# Patient Record
Sex: Female | Born: 1950 | Race: Black or African American | Hispanic: No | Marital: Single | Smoking: Current some day smoker
Health system: Southern US, Community
[De-identification: ages and names within clinical notes are randomized; demographics above are authoritative.]

## PROBLEM LIST (undated history)

## (undated) DIAGNOSIS — M1990 Unspecified osteoarthritis, unspecified site: Secondary | ICD-10-CM

## (undated) DIAGNOSIS — E785 Hyperlipidemia, unspecified: Secondary | ICD-10-CM

## (undated) DIAGNOSIS — M5430 Sciatica, unspecified side: Secondary | ICD-10-CM

## (undated) DIAGNOSIS — M419 Scoliosis, unspecified: Secondary | ICD-10-CM

## (undated) DIAGNOSIS — I10 Essential (primary) hypertension: Secondary | ICD-10-CM

## (undated) DIAGNOSIS — E079 Disorder of thyroid, unspecified: Secondary | ICD-10-CM

## (undated) HISTORY — PX: TUBAL LIGATION: SHX77

## (undated) HISTORY — PX: GANGLION CYST EXCISION: SHX1691

---

## 2019-10-22 ENCOUNTER — Other Ambulatory Visit: Payer: Self-pay | Admitting: Internal Medicine

## 2019-10-22 DIAGNOSIS — R5381 Other malaise: Secondary | ICD-10-CM

## 2019-10-22 DIAGNOSIS — Z1231 Encounter for screening mammogram for malignant neoplasm of breast: Secondary | ICD-10-CM

## 2019-10-28 ENCOUNTER — Ambulatory Visit (HOSPITAL_COMMUNITY)
Admission: EM | Admit: 2019-10-28 | Discharge: 2019-10-28 | Disposition: A | Discharge status: Home / Self Care | Payer: Medicare Other | Attending: Internal Medicine | Admitting: Internal Medicine

## 2019-10-28 ENCOUNTER — Other Ambulatory Visit: Payer: Self-pay

## 2019-10-28 ENCOUNTER — Encounter (HOSPITAL_COMMUNITY): Payer: Self-pay

## 2019-10-28 ENCOUNTER — Ambulatory Visit (INDEPENDENT_AMBULATORY_CARE_PROVIDER_SITE_OTHER): Payer: Medicare Other

## 2019-10-28 DIAGNOSIS — L03019 Cellulitis of unspecified finger: Secondary | ICD-10-CM

## 2019-10-28 DIAGNOSIS — S6991XA Unspecified injury of right wrist, hand and finger(s), initial encounter: Secondary | ICD-10-CM

## 2019-10-28 DIAGNOSIS — M795 Residual foreign body in soft tissue: Secondary | ICD-10-CM

## 2019-10-28 HISTORY — DX: Scoliosis, unspecified: M41.9

## 2019-10-28 HISTORY — DX: Disorder of thyroid, unspecified: E07.9

## 2019-10-28 HISTORY — DX: Unspecified osteoarthritis, unspecified site: M19.90

## 2019-10-28 HISTORY — DX: Hyperlipidemia, unspecified: E78.5

## 2019-10-28 HISTORY — DX: Essential (primary) hypertension: I10

## 2019-10-28 HISTORY — DX: Sciatica, unspecified side: M54.30

## 2019-10-28 MED ORDER — SULFAMETHOXAZOLE-TRIMETHOPRIM 800-160 MG PO TABS: 1.0000 | ORAL_TABLET | Freq: Two times a day (BID) | ORAL | 0 refills | Status: AC

## 2019-10-28 NOTE — ED Triage Notes (Signed)
Pt states she was washing her dishes with a scrubby and the scrubby went into her right 3rd finger 2 wks ago and 3 days ago the tip of the finger turned black.

## 2019-10-28 NOTE — ED Provider Notes (Addendum)
[facility]    CSN: [id number] Arrival date & time: 10/28/19  1020      History   Chief Complaint Chief Complaint  Patient presents with   Finger Injury    HPI Caroline Mcconnell is a 69 y.o. female.   Patient is a 69 year old female presents today with swelling, tenderness to tip of right middle finger.  Reporting she was washing a pan with a aluminum scrub pad approximate 2 weeks ago and feels like she may have gotten a piece of the scar pad in her finger.  She did not have any bleeding at that time.  3 days ago she started having discoloration to the tip of her finger.  Very tender to touch.  No fevers.     Past Medical History:  Diagnosis Date   Arthritis    Hyperlipidemia    Hypertension    Sciatica    Scoliosis    Thyroid disease     There are no problems to display for this patient.   Past Surgical History:  Procedure Laterality Date   GANGLION CYST EXCISION Right    TUBAL LIGATION      OB History   No obstetric history on file.      Home Medications    Prior to Admission medications   Medication Sig Start Date End Date Taking? Authorizing Provider  amLODipine (NORVASC) 10 MG tablet Take 10 mg by mouth daily. 09/24/19   [provider]  EUTHYROX 25 MCG tablet Take 25 mcg by mouth daily. 09/24/19   [provider]  lisinopril (ZESTRIL) 20 MG tablet Take 20 mg by mouth daily. 09/24/19   [provider]  rosuvastatin (CRESTOR) 20 MG tablet Take 20 mg by mouth at bedtime. 10/21/19   [provider]  sulfamethoxazole-trimethoprim (BACTRIM DS) 800-160 MG tablet Take 1 tablet by mouth 2 (two) times daily for 7 days. 10/28/19 11/04/19  Janace Aris, NP    Family History No family history on file.  Social History Social History   Tobacco Use   Smoking status: Current Every Day Smoker    Packs/day: 0.23    Types: Cigarettes   Smokeless tobacco: Never Used  Substance Use Topics   Alcohol use: Never     Drug use: Never     Allergies   Penicillins   Review of Systems Review of Systems   Physical Exam Triage Vital Signs ED Triage Vitals  Enc Vitals Group     BP 10/28/19 1126 (!) 156/83     Pulse Rate 10/28/19 1126 82     Resp 10/28/19 1126 18     Temp 10/28/19 1126 98.8 F (37.1 C)     Temp Source 10/28/19 1126 Oral     SpO2 10/28/19 1126 96 %     Weight 10/28/19 1127 210 lb (95.3 kg)     Height 10/28/19 1127 5\' 6"  (1.676 m)     Head Circumference --      Peak Flow --      Pain Score 10/28/19 1127 4     Pain Loc --      Pain Edu? --      Excl. in GC? --    No data found.  Updated Vital Signs BP (!) 156/83    Pulse 82    Temp 98.8 F (37.1 C) (Oral)    Resp 18    Ht 5\' 6"  (1.676 m)    Wt 210 lb (95.3 kg)    SpO2 96%  BMI 33.89 kg/m   Visual Acuity Right Eye Distance:   Left Eye Distance:   Bilateral Distance:    Right Eye Near:   Left Eye Near:    Bilateral Near:     Physical Exam Vitals and nursing note reviewed.  Constitutional:      General: She is not in acute distress.    Appearance: Normal appearance. She is not ill-appearing, toxic-appearing or diaphoretic.  HENT:     Head: Normocephalic.     Nose: Nose normal.  Eyes:     Conjunctiva/sclera: Conjunctivae normal.  Pulmonary:     Effort: Pulmonary effort is normal.  Musculoskeletal:        General: Normal range of motion.     Cervical back: Normal range of motion.  Skin:    General: Skin is warm and dry.     Findings: No rash.  Neurological:     Mental Status: She is alert.  Psychiatric:        Mood and Affect: Mood normal.        UC Treatments / Results  Labs (all labs ordered are listed, but only abnormal results are displayed) Labs Reviewed - No data to display  EKG   Radiology DG Finger Middle Right  Result Date: 10/28/2019 CLINICAL DATA:  Injury, foreign body EXAM: RIGHT MIDDLE FINGER 2+V COMPARISON:  None. FINDINGS: There is no evidence of fracture or dislocation.  There is no evidence of arthropathy or other focal bone abnormality. Soft tissue swelling about the distal phalanx. There is a 5 mm radiolucency underlying the dermis. IMPRESSION: Distal middle finger soft tissue swelling. 5 mm lucency underlying the dermis along the anterior aspect may reflect a focus of gas versus foreign body. No acute osseous abnormality or focal erosion. Electronically Signed   By: Stana Bunting M.D.   On: 10/28/2019 12:09    Procedures Incision and Drainage  Date/Time: 10/28/2019 1:14 PM Performed by: Janace Aris, NP Authorized by: Merrilee Jansky, MD   Consent:    Consent obtained:  Verbal   Consent given by:  Patient   Risks discussed:  Bleeding, incomplete drainage and pain   Alternatives discussed:  No treatment, delayed treatment and alternative treatment Location:    Indications for incision and drainage: felon.   Location:  Upper extremity   Upper extremity location:  Finger   Finger location:  R long finger Pre-procedure details:    Skin preparation:  Antiseptic wash Anesthesia (see MAR for exact dosages):    Anesthesia method:  Nerve block   Block needle gauge:  27 G   Block anesthetic:  Lidocaine 2% w/o epi   Block injection procedure:  Anatomic landmarks identified and introduced needle   Block outcome:  Anesthesia achieved Procedure type:    Complexity:  Simple Procedure details:    Incision types:  Stab incision   Incision depth:  Dermal   Scalpel blade:  11   Drainage:  Bloody and purulent   Drainage amount:  Moderate   Wound treatment:  Wound left open   Packing materials:  None Post-procedure details:    Patient tolerance of procedure:  Tolerated well, no immediate complications   (including critical care time)  Medications Ordered in UC Medications - No data to display  Initial Impression / Assessment and Plan / UC Course  I have reviewed the triage vital signs and the nursing notes.  Pertinent labs & imaging results  that were available during my care of the patient were reviewed  by me and considered in my medical decision making (see chart for details).     Felon X-ray with no concern for osteo-.  5 mm radiolucency overlying the dermis may reflect focal gas or foreign body. Patient none with digital block here and incision and drainage done here in clinic today.  Patient tolerated well.  Moderate amount of purulent drainage from site Will place patient on antibiotic coverage with Bactrim due to penicillin allergy Return precautions given. Final Clinical Impressions(s) / UC Diagnoses   Final diagnoses:  Felon of finger     Discharge Instructions     Take the antibiotics as prescribed. Follow up as needed for continued or worsening symptoms     ED Prescriptions    Medication Sig Dispense Auth. Provider   sulfamethoxazole-trimethoprim (BACTRIM DS) 800-160 MG tablet Take 1 tablet by mouth 2 (two) times daily for 7 days. 14 tablet Talana Slatten A, NP     PDMP not reviewed this encounter.   Janace Aris, NP 10/28/19 1313    Dahlia Byes A, NP 10/28/19 1315

## 2019-10-28 NOTE — Discharge Instructions (Addendum)
Take the antibiotics as prescribed Follow up as needed for continued or worsening symptoms  

## 2019-11-03 ENCOUNTER — Other Ambulatory Visit (HOSPITAL_COMMUNITY): Payer: Self-pay | Admitting: Internal Medicine

## 2019-11-03 ENCOUNTER — Inpatient Hospital Stay (HOSPITAL_COMMUNITY): Admission: RE | Admit: 2019-11-03 | Payer: Self-pay | Source: Ambulatory Visit

## 2019-11-03 ENCOUNTER — Other Ambulatory Visit (HOSPITAL_COMMUNITY): Payer: Self-pay

## 2019-11-03 DIAGNOSIS — I700 Atherosclerosis of aorta: Secondary | ICD-10-CM

## 2019-11-05 ENCOUNTER — Other Ambulatory Visit: Payer: Self-pay | Admitting: Internal Medicine

## 2019-11-05 DIAGNOSIS — E038 Other specified hypothyroidism: Secondary | ICD-10-CM

## 2019-11-24 ENCOUNTER — Other Ambulatory Visit (HOSPITAL_COMMUNITY): Payer: Self-pay | Admitting: Internal Medicine

## 2019-11-24 DIAGNOSIS — I739 Peripheral vascular disease, unspecified: Secondary | ICD-10-CM

## 2019-11-24 DIAGNOSIS — Z0000 Encounter for general adult medical examination without abnormal findings: Secondary | ICD-10-CM

## 2019-11-25 ENCOUNTER — Ambulatory Visit (INDEPENDENT_AMBULATORY_CARE_PROVIDER_SITE_OTHER)
Admission: RE | Admit: 2019-11-25 | Discharge: 2019-11-25 | Disposition: A | Discharge status: Home / Self Care | Payer: Medicare Other | Source: Ambulatory Visit | Attending: Internal Medicine | Admitting: Internal Medicine

## 2019-11-25 ENCOUNTER — Ambulatory Visit (HOSPITAL_COMMUNITY)
Admission: RE | Admit: 2019-11-25 | Discharge: 2019-11-25 | Disposition: A | Discharge status: Home / Self Care | Payer: Medicare Other | Source: Ambulatory Visit | Attending: Internal Medicine | Admitting: Internal Medicine

## 2019-11-25 ENCOUNTER — Other Ambulatory Visit: Payer: Self-pay

## 2019-11-25 DIAGNOSIS — I739 Peripheral vascular disease, unspecified: Secondary | ICD-10-CM | POA: Insufficient documentation

## 2019-11-25 DIAGNOSIS — I10 Essential (primary) hypertension: Secondary | ICD-10-CM | POA: Diagnosis not present

## 2019-11-25 DIAGNOSIS — Z0000 Encounter for general adult medical examination without abnormal findings: Secondary | ICD-10-CM | POA: Diagnosis present

## 2019-11-25 DIAGNOSIS — F17200 Nicotine dependence, unspecified, uncomplicated: Secondary | ICD-10-CM | POA: Diagnosis not present

## 2019-11-25 DIAGNOSIS — Z136 Encounter for screening for cardiovascular disorders: Secondary | ICD-10-CM | POA: Diagnosis present

## 2019-11-25 DIAGNOSIS — E785 Hyperlipidemia, unspecified: Secondary | ICD-10-CM | POA: Insufficient documentation

## 2020-09-24 ENCOUNTER — Ambulatory Visit (HOSPITAL_COMMUNITY)
Admission: EM | Admit: 2020-09-24 | Discharge: 2020-09-24 | Disposition: A | Discharge status: Home / Self Care | Payer: Medicare Other | Attending: Medical Oncology | Admitting: Medical Oncology

## 2020-09-24 ENCOUNTER — Encounter (HOSPITAL_COMMUNITY): Payer: Self-pay

## 2020-09-24 ENCOUNTER — Other Ambulatory Visit: Payer: Self-pay

## 2020-09-24 DIAGNOSIS — M65312 Trigger thumb, left thumb: Secondary | ICD-10-CM

## 2020-09-24 MED ORDER — ETODOLAC 400 MG PO TABS: 400.0000 mg | ORAL_TABLET | Freq: Two times a day (BID) | ORAL | 0 refills | Status: DC

## 2020-09-24 NOTE — ED Provider Notes (Signed)
[facility]    CSN: [id number] Arrival date & time: 09/24/20  1005      History   Chief Complaint Chief Complaint  Patient presents with   thumb pain    HPI Caroline Mcconnell is a 70 y.o. female.   HPI  Thumb Pain: Pt reports that she has had a popping sensation of her left thumb for the past 2 months. She reports that it hurts to touch and move. When she moves the joint it pops. She has tried icy hot with tylenol with some relief. No loss of sensation, injury, skin color change.   Past Medical History:  Diagnosis Date   Arthritis    Hyperlipidemia    Hypertension    Sciatica    Scoliosis    Thyroid disease     There are no problems to display for this patient.   Past Surgical History:  Procedure Laterality Date   GANGLION CYST EXCISION Right    TUBAL LIGATION      OB History   No obstetric history on file.      Home Medications    Prior to Admission medications   Medication Sig Start Date End Date Taking? Authorizing Provider  amLODipine (NORVASC) 10 MG tablet Take 10 mg by mouth daily. 09/24/19   [provider]  EUTHYROX 25 MCG tablet Take 25 mcg by mouth daily. 09/24/19   [provider]  lisinopril (ZESTRIL) 20 MG tablet Take 20 mg by mouth daily. 09/24/19   [provider]  rosuvastatin (CRESTOR) 20 MG tablet Take 20 mg by mouth at bedtime. 10/21/19   [provider]    Family History Family History  Family history unknown: Yes    Social History Social History   Tobacco Use   Smoking status: Every Day    Packs/day: 0.23    Types: Cigarettes   Smokeless tobacco: Never  Substance Use Topics   Alcohol use: Never   Drug use: Never     Allergies   Penicillins   Review of Systems Review of Systems  As stated above in HPI Physical Exam Triage Vital Signs ED Triage Vitals  Enc Vitals Group     BP 09/24/20 1114 132/86     Pulse Rate 09/24/20 1111 80     Resp 09/24/20 1111 17     Temp  09/24/20 1111 97.9 F (36.6 C)     Temp Source 09/24/20 1111 Oral     SpO2 09/24/20 1111 97 %     Weight --      Height --      Head Circumference --      Peak Flow --      Pain Score 09/24/20 1110 10     Pain Loc --      Pain Edu? --      Excl. in GC? --    No data found.  Updated Vital Signs BP 132/86   Pulse 80   Temp 97.9 F (36.6 C) (Oral)   Resp 17   LMP  (LMP Unknown)   SpO2 97%   Physical Exam Vitals and nursing note reviewed.  Constitutional:      Appearance: Normal appearance.  Cardiovascular:     Pulses: Normal pulses.  Musculoskeletal:        General: Tenderness present. No swelling.     Comments: Abrupt flexion of the left thumb at DIP joint with palpable and tender nodule of this finger  Skin:    General: Skin is  warm.     Coloration: Skin is not pale.     Findings: No bruising, erythema, lesion or rash.  Neurological:     Mental Status: She is alert.     UC Treatments / Results  Labs (all labs ordered are listed, but only abnormal results are displayed) Labs Reviewed - No data to display  EKG   Radiology No results found.  Procedures Procedures (including critical care time)  Medications Ordered in UC Medications - No data to display  Initial Impression / Assessment and Plan / UC Course  I have reviewed the triage vital signs and the nursing notes.  Pertinent labs & imaging results that were available during my care of the patient were reviewed by me and considered in my medical decision making (see chart for details).     New.  Discussed trigger finger with patient.  I have recommended a thumb spica x2 weeks along with NSAIDs and referral to orthopedics if needed.  Discussed red flag signs and symptoms. Final Clinical Impressions(s) / UC Diagnoses   Final diagnoses:  None   Discharge Instructions   None    ED Prescriptions   None    PDMP not reviewed this encounter.   Rushie Chestnut, [place] 09/24/20 1211

## 2020-09-24 NOTE — ED Triage Notes (Signed)
Pt presents with c/o her left thumb popping when bending it.

## 2021-03-01 ENCOUNTER — Encounter (HOSPITAL_COMMUNITY): Payer: Self-pay | Admitting: *Deleted

## 2021-03-01 ENCOUNTER — Other Ambulatory Visit: Payer: Self-pay

## 2021-03-01 ENCOUNTER — Ambulatory Visit (HOSPITAL_COMMUNITY)
Admission: EM | Admit: 2021-03-01 | Discharge: 2021-03-01 | Disposition: A | Discharge status: Home / Self Care | Payer: Medicare Other | Attending: Physician Assistant | Admitting: Physician Assistant

## 2021-03-01 ENCOUNTER — Ambulatory Visit (INDEPENDENT_AMBULATORY_CARE_PROVIDER_SITE_OTHER): Payer: Medicare Other

## 2021-03-01 DIAGNOSIS — K047 Periapical abscess without sinus: Secondary | ICD-10-CM

## 2021-03-01 DIAGNOSIS — G8929 Other chronic pain: Secondary | ICD-10-CM | POA: Diagnosis not present

## 2021-03-01 DIAGNOSIS — M25522 Pain in left elbow: Secondary | ICD-10-CM | POA: Diagnosis not present

## 2021-03-01 DIAGNOSIS — M7712 Lateral epicondylitis, left elbow: Secondary | ICD-10-CM | POA: Diagnosis not present

## 2021-03-01 DIAGNOSIS — R220 Localized swelling, mass and lump, head: Secondary | ICD-10-CM | POA: Diagnosis not present

## 2021-03-01 MED ORDER — CLINDAMYCIN HCL 300 MG PO CAPS: 300.0000 mg | ORAL_CAPSULE | Freq: Three times a day (TID) | ORAL | 0 refills | Status: DC

## 2021-03-01 MED ORDER — NAPROXEN 375 MG PO TABS: 375.0000 mg | ORAL_TABLET | Freq: Two times a day (BID) | ORAL | 0 refills | Status: DC

## 2021-03-01 NOTE — ED Triage Notes (Signed)
Pt reports Lt sided facial swelling this morning and Pt has swelling to the Lt elbow.

## 2021-03-01 NOTE — ED Provider Notes (Signed)
[facility]    CSN: [id number] Arrival date & time: 03/01/21  1200      History   Chief Complaint Chief Complaint  Patient presents with   Joint Swelling   Facial Swelling    HPI Caroline Mcconnell is a 71 y.o. female.   Patient presents today with several concerns.  She reports this morning waking up and having significant swelling over her left lower jaw.  She has used warm compresses and this is significantly improved but she continues to have some pain prompting evaluation.  She is scheduled to have multiple tooth extractions March 09, 2021 and does have a history of abnormal dentalgia.  She initially had some difficulty swallowing this morning but this has since resolved.  She was able to eat lunch and drink plenty of fluid without any difficulty earlier today.  She denies any recent antibiotics.  Denies any throat swelling, shortness of breath, dysphagia, fever, nausea, vomiting.  In addition, patient reports a several year history of left elbow pain and swelling.  She denies any known injury or increase in activity prior to symptom onset.  She has not had imaging or seen a specialist.  This previously been attributed to arthritis of her PCP.  She has tried NSAIDs without improvement of symptoms.  She is right-handed.  Denies any weakness, numbness, paresthesia.  Denies history of gout or rheumatoid arthritis.  She reports pain is intermittent without identifiable trigger.   Past Medical History:  Diagnosis Date   Arthritis    Hyperlipidemia    Hypertension    Sciatica    Scoliosis    Thyroid disease     There are no problems to display for this patient.   Past Surgical History:  Procedure Laterality Date   GANGLION CYST EXCISION Right    TUBAL LIGATION      OB History   No obstetric history on file.      Home Medications    Prior to Admission medications   Medication Sig Start Date End Date Taking? Authorizing Provider  clindamycin (CLEOCIN) 300  MG capsule Take 1 capsule (300 mg total) by mouth 3 (three) times daily. 03/01/21  Yes Rashad Obeid K, PA-C  naproxen (NAPROSYN) 375 MG tablet Take 1 tablet (375 mg total) by mouth 2 (two) times daily. 03/01/21  Yes Viveca Beckstrom K, PA-C  amLODipine (NORVASC) 10 MG tablet Take 10 mg by mouth daily. 09/24/19   [provider]  EUTHYROX 25 MCG tablet Take 25 mcg by mouth daily. 09/24/19   [provider]  lisinopril (ZESTRIL) 20 MG tablet Take 20 mg by mouth daily. 09/24/19   [provider]  rosuvastatin (CRESTOR) 20 MG tablet Take 20 mg by mouth at bedtime. 10/21/19   [provider]    Family History Family History  Family history unknown: Yes    Social History Social History   Tobacco Use   Smoking status: Every Day    Packs/day: 0.23    Types: Cigarettes   Smokeless tobacco: Never  Substance Use Topics   Alcohol use: Never   Drug use: Never     Allergies   Penicillins   Review of Systems Review of Systems  Constitutional:  Positive for activity change. Negative for appetite change, fatigue and fever.  HENT:  Positive for dental problem and facial swelling. Negative for congestion, drooling, sinus pressure, sneezing, sore throat, trouble swallowing and voice change.   Respiratory:  Negative for cough and shortness of breath.  Cardiovascular:  Negative for chest pain.  Gastrointestinal:  Negative for abdominal pain, diarrhea, nausea and vomiting.  Musculoskeletal:  Positive for arthralgias and joint swelling. Negative for myalgias.  Neurological:  Negative for dizziness, light-headedness and headaches.    Physical Exam Triage Vital Signs ED Triage Vitals  Enc Vitals Group     BP 03/01/21 1226 (!) 143/70     Pulse Rate 03/01/21 1226 86     Resp 03/01/21 1226 18     Temp 03/01/21 1226 98.6 F (37 C)     Temp src --      SpO2 03/01/21 1226 99 %     Weight --      Height --      Head Circumference --      Peak Flow --      Pain Score  03/01/21 1225 0     Pain Loc --      Pain Edu? --      Excl. in GC? --    No data found.  Updated Vital Signs BP (!) 143/70    Pulse 86    Temp 98.6 F (37 C)    Resp 18    LMP  (LMP Unknown)    SpO2 99%   Visual Acuity Right Eye Distance:   Left Eye Distance:   Bilateral Distance:    Right Eye Near:   Left Eye Near:    Bilateral Near:     Physical Exam Vitals reviewed.  Constitutional:      General: She is awake. She is not in acute distress.    Appearance: Normal appearance. She is well-developed. She is not ill-appearing.     Comments: Very pleasant female appears stated age in no acute distress sitting comfortably in exam room on Rollator  HENT:     Head: Normocephalic and atraumatic.     Right Ear: Tympanic membrane, ear canal and external ear normal. Tympanic membrane is not erythematous or bulging.     Left Ear: Tympanic membrane, ear canal and external ear normal. Tympanic membrane is not erythematous or bulging.     Nose:     Right Sinus: No maxillary sinus tenderness or frontal sinus tenderness.     Left Sinus: No maxillary sinus tenderness or frontal sinus tenderness.     Mouth/Throat:     Dentition: Abnormal dentition. Gingival swelling and dental abscesses present.     Pharynx: Uvula midline. No oropharyngeal exudate or posterior oropharyngeal erythema.     Tonsils: No tonsillar exudate or tonsillar abscesses.      Comments: No evidence of Ludwig angina on exam Cardiovascular:     Rate and Rhythm: Normal rate and regular rhythm.     Pulses:          Radial pulses are 2+ on the right side and 2+ on the left side.     Heart sounds: Normal heart sounds, S1 normal and S2 normal. No murmur heard.    Comments: Capillary refill within 2 seconds bilateral fingers Pulmonary:     Effort: Pulmonary effort is normal.     Breath sounds: Normal breath sounds. No wheezing, rhonchi or rales.     Comments: Clear to auscultation bilaterally Musculoskeletal:     Left  elbow: Swelling present. Normal range of motion. Tenderness present in lateral epicondyle.     Comments: Left elbow: Mild swelling and tenderness palpation over lateral epicondyle.  No deformity noted.  Strength 5/5.  Normal active range of motion with flexion and extension.  Lymphadenopathy:     Head:     Right side of head: No submental, submandibular or tonsillar adenopathy.     Left side of head: No submental, submandibular or tonsillar adenopathy.     Cervical: No cervical adenopathy.  Psychiatric:        Behavior: Behavior is cooperative.     UC Treatments / Results  Labs (all labs ordered are listed, but only abnormal results are displayed) Labs Reviewed - No data to display  EKG   Radiology DG Elbow Complete Left  Result Date: 03/01/2021 CLINICAL DATA:  Chronic pain EXAM: LEFT ELBOW - COMPLETE 4 VIEW COMPARISON:  None. FINDINGS: There is no evidence of fracture, dislocation, or joint effusion. There is no evidence of significant arthropathy or other focal bone abnormality. Soft tissues are unremarkable. IMPRESSION: Negative. Electronically Signed   By: Allegra LaiLeah  Strickland M.D.   On: 03/01/2021 13:17    Procedures Procedures (including critical care time)  Medications Ordered in UC Medications - No data to display  Initial Impression / Assessment and Plan / UC Course  I have reviewed the triage vital signs and the nursing notes.  Pertinent labs & imaging results that were available during my care of the patient were reviewed by me and considered in my medical decision making (see chart for details).     Concern for dental infection given clinical presentation with dental pain and associated facial swelling.  Patient has allergy to penicillin so was prescribed clindamycin 300 mg 3 times daily.  Discussed that if she develops any severe abdominal distress including diarrhea she should stop the medication and be seen.  She can use over-the-counter analgesics as needed for  symptom relief.  She is scheduled to see dentist in approximately 1.5 weeks, strongly encouraged to keep this appointment for further evaluation and management.  Discussed that if she has any worsening symptoms including difficulty speaking, difficulty swallowing, swelling of her throat, fever, change in her voice she needs to go to the emergency room.  Strict return precautions given to which she expressed understanding.  X-ray obtained showed no acute abnormality.  Concern for lateral epicondylitis given clinical presentation.  Will treat with NSAIDs and she was instructed not to take additional NSAIDs with this medication (Naprosyn as prescribed) as it will cause stomach bleeding.  Discussed that she should follow-up with sports medicine if symptoms persist and was given contact information for local provider.  Discussed alarm symptoms that would warrant emergent evaluation.  Strict return precautions given to which she expressed understanding.  Final Clinical Impressions(s) / UC Diagnoses   Final diagnoses:  Dental infection  Facial swelling  Left elbow pain  Lateral epicondylitis of left elbow     Discharge Instructions      We are treating you for dental infection.  Please start clindamycin 3 times a day.  This can upset your stomach so take it with food.  If you develop severe abdominal upset including diarrhea please stop the medication and be seen.  Use warm compress on the area of swelling and pain.  It is importantly follow-up with a dentist as soon as possible as we discussed.  If you have any worsening symptoms including difficulty swallowing, swelling of your throat, change in your voice, shortness of breath you need to go to emergency room.  Your x-ray was normal.  Please take Naprosyn to help with potential soft tissue injury.  Do not take additional NSAIDs including aspirin, ibuprofen/Advil, naproxen/Aleve with this medication as it can  cause stomach bleeding.  You can use Tylenol  if needed for additional symptom relief.  If symptoms or not improving please follow-up with sports medicine for further evaluation and management.     ED Prescriptions     Medication Sig Dispense Auth. Provider   clindamycin (CLEOCIN) 300 MG capsule Take 1 capsule (300 mg total) by mouth 3 (three) times daily. 30 capsule Jamonica Schoff K, PA-C   naproxen (NAPROSYN) 375 MG tablet Take 1 tablet (375 mg total) by mouth 2 (two) times daily. 20 tablet Kolsen Choe, Noberto RetortErin K, PA-C      PDMP not reviewed this encounter.   Jeani HawkingRaspet, Chen Saadeh K, PA-C 03/01/21 1332

## 2021-03-01 NOTE — Discharge Instructions (Addendum)
We are treating you for dental infection.  Please start clindamycin 3 times a day.  This can upset your stomach so take it with food.  If you develop severe abdominal upset including diarrhea please stop the medication and be seen.  Use warm compress on the area of swelling and pain.  It is importantly follow-up with a dentist as soon as possible as we discussed.  If you have any worsening symptoms including difficulty swallowing, swelling of your throat, change in your voice, shortness of breath you need to go to emergency room.  Your x-ray was normal.  Please take Naprosyn to help with potential soft tissue injury.  Do not take additional NSAIDs including aspirin, ibuprofen/Advil, naproxen/Aleve with this medication as it can cause stomach bleeding.  You can use Tylenol if needed for additional symptom relief.  If symptoms or not improving please follow-up with sports medicine for further evaluation and management.

## 2021-07-10 ENCOUNTER — Other Ambulatory Visit: Payer: Self-pay

## 2021-07-10 ENCOUNTER — Encounter (HOSPITAL_COMMUNITY): Payer: Self-pay | Admitting: Emergency Medicine

## 2021-07-10 ENCOUNTER — Ambulatory Visit (HOSPITAL_COMMUNITY)
Admission: EM | Admit: 2021-07-10 | Discharge: 2021-07-10 | Disposition: A | Discharge status: Home / Self Care | Payer: Medicare Other

## 2021-07-10 DIAGNOSIS — B349 Viral infection, unspecified: Secondary | ICD-10-CM | POA: Diagnosis not present

## 2021-07-10 MED ORDER — ALBUTEROL SULFATE HFA 108 (90 BASE) MCG/ACT IN AERS: 1.0000 | INHALATION_SPRAY | Freq: Four times a day (QID) | RESPIRATORY_TRACT | 0 refills | Status: DC | PRN

## 2021-07-10 MED ORDER — BENZONATATE 100 MG PO CAPS: 100.0000 mg | ORAL_CAPSULE | Freq: Three times a day (TID) | ORAL | 0 refills | Status: DC

## 2021-07-10 MED ORDER — PREDNISONE 20 MG PO TABS: 40.0000 mg | ORAL_TABLET | Freq: Every day | ORAL | 0 refills | Status: AC

## 2021-07-10 NOTE — ED Triage Notes (Addendum)
Onset 2 days ago of coughing, wheezing, runny nose.  Reports coughing up green phlegm  OTC cough medicine

## 2021-07-10 NOTE — Discharge Instructions (Addendum)
-  Prednisone, 2 pills taken at the same time for 5 days in a row.  Try taking this earlier in the day as it can give you energy. Avoid NSAIDs like ibuprofen and alleve while taking this medication as they can increase your risk of stomach upset and even GI bleeding when in combination with a steroid. You can continue tylenol (acetaminophen) up to 1000mg  3x daily. -Tessalon (Benzonatate) as needed for cough. Take one pill up to 3x daily (every 8 hours) -Albuterol inhaler as needed for cough, wheezing, shortness of breath, 1 to 2 puffs every 6 hours as needed. -Follow-up if symptoms worsen instead of improve - cough, new shortness of breath, coughing up dark or red sputum, new fevers, etc.  -With a virus, you're typically contagious for 5-7 days, or as long as you're having fevers.

## 2021-07-10 NOTE — ED Provider Notes (Signed)
[facility]    CSN: [id number] Arrival date & time: 07/10/21  1059      History   Chief Complaint Chief Complaint  Patient presents with   Cough    HPI Caroline Mcconnell is a 71 y.o. female presenting with cough and congestion for 2 days.  History noncontributory, denies history of pulmonary disease, she is a former smoker.  Describes cough productive of yellow and green sputum, there is no associated dyspnea or shortness of breath.  She states she does have some wheezing at nighttime, but this seems to resolve in the morning.  There is no known fevers, chest pain, dizziness, weakness.  HPI  Past Medical History:  Diagnosis Date   Arthritis    Hyperlipidemia    Hypertension    Sciatica    Scoliosis    Thyroid disease     There are no problems to display for this patient.   Past Surgical History:  Procedure Laterality Date   GANGLION CYST EXCISION Right    TUBAL LIGATION      OB History   No obstetric history on file.      Home Medications    Prior to Admission medications   Medication Sig Start Date End Date Taking? Authorizing Provider  albuterol (VENTOLIN HFA) 108 (90 Base) MCG/ACT inhaler Inhale 1-2 puffs into the lungs every 6 (six) hours as needed for wheezing or shortness of breath. 07/10/21  Yes Rhys MartiniGraham, Crystall Donaldson E, PA-C  benzonatate (TESSALON) 100 MG capsule Take 1 capsule (100 mg total) by mouth every 8 (eight) hours. 07/10/21  Yes Rhys MartiniGraham, Antinio Sanderfer E, PA-C  levothyroxine (SYNTHROID) 25 MCG tablet Take 1 tab by mouth daily for thyroid Oral Once a day for 90 days 07/01/19  Yes [provider]  predniSONE (DELTASONE) 20 MG tablet Take 2 tablets (40 mg total) by mouth daily for 5 days. Take with breakfast or lunch. Avoid NSAIDs (ibuprofen, etc) while taking this medication. 07/10/21 07/15/21 Yes Rhys MartiniGraham, Kristy Schomburg E, PA-C  amLODipine (NORVASC) 10 MG tablet Take 10 mg by mouth daily. 09/24/19   [provider]  clindamycin (CLEOCIN) 300 MG capsule Take 1  capsule (300 mg total) by mouth 3 (three) times daily. Patient not taking: Reported on 07/10/2021 03/01/21   Raspet, Erin K, PA-C  EUTHYROX 25 MCG tablet Take 25 mcg by mouth daily. 09/24/19   [provider]  lisinopril (ZESTRIL) 20 MG tablet Take 20 mg by mouth daily. 09/24/19   [provider]  naproxen (NAPROSYN) 375 MG tablet Take 1 tablet (375 mg total) by mouth 2 (two) times daily. Patient not taking: Reported on 07/10/2021 03/01/21   Raspet, Denny PeonErin K, PA-C  rosuvastatin (CRESTOR) 20 MG tablet Take 20 mg by mouth at bedtime. 10/21/19   [provider]    Family History Family History  Family history unknown: Yes    Social History Social History   Tobacco Use   Smoking status: Every Day    Packs/day: 0.23    Types: Cigarettes   Smokeless tobacco: Never  Vaping Use   Vaping Use: Never used  Substance Use Topics   Alcohol use: Never   Drug use: Never     Allergies   Penicillins   Review of Systems Review of Systems  Constitutional:  Negative for appetite change, chills and fever.  HENT:  Positive for congestion. Negative for ear pain, rhinorrhea, sinus pressure, sinus pain and sore throat.   Eyes:  Negative for redness and visual disturbance.  Respiratory:  Positive for cough. Negative for chest tightness, shortness of breath and wheezing.   Cardiovascular:  Negative for chest pain and palpitations.  Gastrointestinal:  Negative for abdominal pain, constipation, diarrhea, nausea and vomiting.  Genitourinary:  Negative for dysuria, frequency and urgency.  Musculoskeletal:  Negative for myalgias.  Neurological:  Negative for dizziness, weakness and headaches.  Psychiatric/Behavioral:  Negative for confusion.   All other systems reviewed and are negative.   Physical Exam Triage Vital Signs ED Triage Vitals  Enc Vitals Group     BP 07/10/21 1139 135/74     Pulse Rate 07/10/21 1139 73     Resp 07/10/21 1139 20     Temp 07/10/21 1139 98.8 F (37.1  C)     Temp Source 07/10/21 1139 Oral     SpO2 07/10/21 1139 96 %     Weight --      Height --      Head Circumference --      Peak Flow --      Pain Score 07/10/21 1136 4     Pain Loc --      Pain Edu? --      Excl. in GC? --    No data found.  Updated Vital Signs BP 135/74 (BP Location: Right Arm)   Pulse 73   Temp 98.8 F (37.1 C) (Oral)   Resp 20   LMP  (LMP Unknown)   SpO2 96%   Visual Acuity Right Eye Distance:   Left Eye Distance:   Bilateral Distance:    Right Eye Near:   Left Eye Near:    Bilateral Near:     Physical Exam Vitals reviewed.  Constitutional:      General: She is not in acute distress.    Appearance: Normal appearance. She is not ill-appearing.  HENT:     Head: Normocephalic and atraumatic.     Right Ear: Tympanic membrane, ear canal and external ear normal. No tenderness. No middle ear effusion. There is no impacted cerumen. Tympanic membrane is not perforated, erythematous, retracted or bulging.     Left Ear: Tympanic membrane, ear canal and external ear normal. No tenderness.  No middle ear effusion. There is no impacted cerumen. Tympanic membrane is not perforated, erythematous, retracted or bulging.     Nose: Nose normal. No congestion.     Mouth/Throat:     Mouth: Mucous membranes are moist.     Pharynx: Uvula midline. No oropharyngeal exudate or posterior oropharyngeal erythema.  Eyes:     Extraocular Movements: Extraocular movements intact.     Pupils: Pupils are equal, round, and reactive to light.  Cardiovascular:     Rate and Rhythm: Normal rate and regular rhythm.     Heart sounds: Normal heart sounds.  Pulmonary:     Effort: Pulmonary effort is normal.     Breath sounds: Normal breath sounds. No decreased breath sounds, wheezing, rhonchi or rales.     Comments: No adventitious breath sounds. Oxygenating comfortably. Abdominal:     Palpations: Abdomen is soft.     Tenderness: There is no abdominal tenderness. There is no  guarding or rebound.  Lymphadenopathy:     Cervical: No cervical adenopathy.     Right cervical: No superficial cervical adenopathy.    Left cervical: No superficial cervical adenopathy.  Neurological:     General: No focal deficit present.     Mental Status: She is alert and oriented to person, place, and time.  Psychiatric:  Mood and Affect: Mood normal.        Behavior: Behavior normal.        Thought Content: Thought content normal.        Judgment: Judgment normal.     UC Treatments / Results  Labs (all labs ordered are listed, but only abnormal results are displayed) Labs Reviewed - No data to display  EKG   Radiology No results found.  Procedures Procedures (including critical care time)  Medications Ordered in UC Medications - No data to display  Initial Impression / Assessment and Plan / UC Course  I have reviewed the triage vital signs and the nursing notes.  Pertinent labs & imaging results that were available during my care of the patient were reviewed by me and considered in my medical decision making (see chart for details).     This patient is a very pleasant 71 y.o. year old female presenting with viral syndrome x2 days. Today this pt is afebrile nontachycardic nontachypneic, oxygenating well on room air, no wheezes rhonchi or rales. Former smoker, no formal diagnosis of pulm ds. Cough is productive of yellow and green sputum and there is no CP, SOB, or fevers.  Will manage with low-dose prednisone given wheezing at nighttime. She is not a diabetic. Also sent albuterol and tessalon.   ED return precautions discussed. Patient verbalizes understanding and agreement.     Final Clinical Impressions(s) / UC Diagnoses   Final diagnoses:  Viral syndrome     Discharge Instructions      -Prednisone, 2 pills taken at the same time for 5 days in a row.  Try taking this earlier in the day as it can give you energy. Avoid NSAIDs like ibuprofen and  alleve while taking this medication as they can increase your risk of stomach upset and even GI bleeding when in combination with a steroid. You can continue tylenol (acetaminophen) up to 1000mg  3x daily. -Tessalon (Benzonatate) as needed for cough. Take one pill up to 3x daily (every 8 hours) -Albuterol inhaler as needed for cough, wheezing, shortness of breath, 1 to 2 puffs every 6 hours as needed. -Follow-up if symptoms worsen instead of improve - cough, new shortness of breath, coughing up dark or red sputum, new fevers, etc.  -With a virus, you're typically contagious for 5-7 days, or as long as you're having fevers.    ED Prescriptions     Medication Sig Dispense Auth. Provider   predniSONE (DELTASONE) 20 MG tablet Take 2 tablets (40 mg total) by mouth daily for 5 days. Take with breakfast or lunch. Avoid NSAIDs (ibuprofen, etc) while taking this medication. 10 tablet , PA-C   albuterol (VENTOLIN HFA) 108 (90 Base) MCG/ACT inhaler Inhale 1-2 puffs into the lungs every 6 (six) hours as needed for wheezing or shortness of breath. 1 each Rhys Martini, PA-C   benzonatate (TESSALON) 100 MG capsule Take 1 capsule (100 mg total) by mouth every 8 (eight) hours. 21 capsule Rhys Martini, PA-C      PDMP not reviewed this encounter.   Rhys Martini, PA-C 07/10/21 1200

## 2021-09-02 ENCOUNTER — Encounter (HOSPITAL_COMMUNITY): Payer: Self-pay

## 2021-09-02 ENCOUNTER — Ambulatory Visit (HOSPITAL_COMMUNITY)
Admission: EM | Admit: 2021-09-02 | Discharge: 2021-09-02 | Discharge status: Against Medical Advice | Payer: Medicare Other

## 2021-09-02 DIAGNOSIS — Z5321 Procedure and treatment not carried out due to patient leaving prior to being seen by health care provider: Secondary | ICD-10-CM

## 2021-09-02 NOTE — ED Triage Notes (Signed)
Patient having swelling in the tip of the middle finger of the right hand. States last year she got a piece of a metal dish washing scrub pad in the finger. States this was removed a year ago but there is swelling and a hard spot on the tip of the finger now starting 2-3 weeks ago.

## 2021-09-02 NOTE — ED Notes (Signed)
Patient states she has an eye doctor appointment to go to. She states she will come be seen at another time.

## 2021-09-02 NOTE — ED Provider Notes (Signed)
Patient left without being seen by provider   Bing Neighbors, FNP 09/02/21 [phone]

## 2021-12-11 ENCOUNTER — Other Ambulatory Visit: Payer: Self-pay

## 2021-12-11 ENCOUNTER — Emergency Department (HOSPITAL_COMMUNITY): Payer: Medicare Other

## 2021-12-11 ENCOUNTER — Emergency Department (HOSPITAL_COMMUNITY)
Admission: EM | Admit: 2021-12-11 | Discharge: 2021-12-11 | Disposition: A | Discharge status: Home / Self Care | Payer: Medicare Other | Attending: Emergency Medicine | Admitting: Emergency Medicine

## 2021-12-11 ENCOUNTER — Encounter (HOSPITAL_COMMUNITY): Payer: Self-pay

## 2021-12-11 DIAGNOSIS — M546 Pain in thoracic spine: Secondary | ICD-10-CM | POA: Insufficient documentation

## 2021-12-11 DIAGNOSIS — Z1152 Encounter for screening for COVID-19: Secondary | ICD-10-CM | POA: Insufficient documentation

## 2021-12-11 DIAGNOSIS — I10 Essential (primary) hypertension: Secondary | ICD-10-CM | POA: Insufficient documentation

## 2021-12-11 DIAGNOSIS — Z79899 Other long term (current) drug therapy: Secondary | ICD-10-CM | POA: Diagnosis not present

## 2021-12-11 DIAGNOSIS — M549 Dorsalgia, unspecified: Secondary | ICD-10-CM

## 2021-12-11 LAB — TROPONIN I (HIGH SENSITIVITY): Troponin I (High Sensitivity): 14 ng/L (ref <18)

## 2021-12-11 LAB — CBC WITH DIFFERENTIAL/PLATELET
Abs Immature Granulocytes: 0.04 10*3/uL (ref 0.00–0.07)
Basophils Absolute: 0 10*3/uL (ref 0.0–0.1)
Basophils Relative: 0 %
Eosinophils Absolute: 0.2 10*3/uL (ref 0.0–0.5)
Eosinophils Relative: 2 %
HCT: 45.9 % (ref 36.0–46.0)
Hemoglobin: 14.7 g/dL (ref 12.0–15.0)
Immature Granulocytes: 0 %
Lymphocytes Relative: 32 %
Lymphs Abs: 3.3 10*3/uL (ref 0.7–4.0)
MCH: 29.6 pg (ref 26.0–34.0)
MCHC: 32 g/dL (ref 30.0–36.0)
MCV: 92.4 fL (ref 80.0–100.0)
Monocytes Absolute: 0.8 10*3/uL (ref 0.1–1.0)
Monocytes Relative: 7 %
Neutro Abs: 6 10*3/uL (ref 1.7–7.7)
Neutrophils Relative %: 59 %
Platelets: 339 10*3/uL (ref 150–400)
RBC: 4.97 MIL/uL (ref 3.87–5.11)
RDW: 14.6 % (ref 11.5–15.5)
WBC: 10.3 10*3/uL (ref 4.0–10.5)
nRBC: 0 % (ref 0.0–0.2)

## 2021-12-11 LAB — RESP PANEL BY RT-PCR (FLU A&B, COVID) ARPGX2
Influenza A by PCR: NEGATIVE
Influenza B by PCR: NEGATIVE
SARS Coronavirus 2 by RT PCR: NEGATIVE

## 2021-12-11 LAB — BASIC METABOLIC PANEL
Anion gap: 15 (ref 5–15)
BUN: 14 mg/dL (ref 8–23)
CO2: 23 mmol/L (ref 22–32)
Calcium: 9.6 mg/dL (ref 8.9–10.3)
Chloride: 101 mmol/L (ref 98–111)
Creatinine, Ser: 0.79 mg/dL (ref 0.44–1.00)
GFR, Estimated: >60 mL/min (ref >60)
Glucose, Bld: 105 mg/dL — ABNORMAL HIGH (ref 70–99)
Potassium: 3.8 mmol/L (ref 3.5–5.1)
Sodium: 139 mmol/L (ref 135–145)

## 2021-12-11 MED ORDER — IOHEXOL 350 MG/ML SOLN
75.0000 mL | Freq: Once | INTRAVENOUS | Status: AC | PRN
  Administered 2021-12-11: 75 mL via INTRAVENOUS

## 2021-12-11 MED ORDER — NAPROXEN 500 MG PO TABS: 500.0000 mg | ORAL_TABLET | Freq: Two times a day (BID) | ORAL | 0 refills | Status: AC

## 2021-12-11 NOTE — ED Provider Triage Note (Signed)
Emergency Medicine Provider Triage Evaluation Note  Caroline Mcconnell , a 71 y.o. female  was evaluated in triage.  Patient reports a history of hypertension, hyperlipidemia and thyroid disease otherwise healthy.  Patient sent into ER today from outside urgent care for a positive D-dimer.  Patient reports that over the past 1 week she has been experiencing right sided upper back pain which has been constant, no clear aggravating alleviating factors.  She reports that she thought this was due to a cough and cold that she had the week prior.  She was discharged from the urgent care on Friday but her D-dimer was pending at that point she received a call and was told to go to the ER because the test was positive.  Review of Systems  Positive: Back pain Negative: Chest pain/shortness of breath, hemoptysis, extremity swelling/color change, recent surgery/immobilization or any additional concerns.  Physical Exam  BP (!) 145/71 (BP Location: Left Arm)   Pulse 65   Temp 98.3 F (36.8 C) (Oral)   Resp 16   Ht 5\' 6"  (1.676 m)   Wt 81.2 kg   LMP  (LMP Unknown)   SpO2 98%   BMI 28.89 kg/m  Gen:   Awake, no distress   Resp:  Normal effort, lungs clear to auscultation bilaterally MSK:   Moves extremities without difficulty  Other:  Heart regular rate and rhythm  Medical Decision Making  Medically screening exam initiated at 9:10 AM.  Appropriate orders placed.  Seraphim Affinito was informed that the remainder of the evaluation will be completed by another provider, this initial triage assessment does not replace that evaluation, and the importance of remaining in the ED until their evaluation is complete.  Risk versus benefits of CT imaging were discussed, patient wished to proceed.  Note: Portions of this report may have been transcribed using voice recognition software. Every effort was made to ensure accuracy; however, inadvertent computerized transcription errors may still be present.    Deliah Boston, [place] 12/11/21 [phone]

## 2021-12-11 NOTE — ED Triage Notes (Signed)
Pt arrived POV from home stating she went to urgent care a couple days ago c/o upper back pain and pain near her right shoulder blade. Pt states they did an X-ray and called and told her she had a small PE. Pt denies any SHOB.

## 2021-12-11 NOTE — ED Provider Notes (Signed)
[facility] EMERGENCY DEPARTMENT Provider Note   CSN: [id number] Arrival date & time: 12/11/21  [id number]     History PMH: HTN, HLD Chief Complaint  Patient presents with   Back Pain    Caroline Mcconnell is a 71 y.o. female. Patient presenting with a chief complaint of right-sided upper back pain.  She states about 3 weeks ago she had an upper respiratory illness which she improved from.  She states about 2 weeks ago she started noticing the pain in her posterior right shoulder that radiated down her right side.  This pain was severe and constant, nothing made it better or worse, but has alleviated since she has been in the emergency department.  She has only taken Tylenol for her pain.  She says she was seen by urgent care today and had a chest x-ray as well as an elevated D-dimer.  She was referred to the emergency department for evaluation of pulmonary embolism.  She has no history of blood clots.  She has not had any recent travel or surgery.  She denies any shortness of breath, chest pain, abdominal pain, nausea, vomiting, leg swelling or pain, numbness, or weakness.   Back Pain      Home Medications Prior to Admission medications   Medication Sig Start Date End Date Taking? Authorizing Provider  naproxen (NAPROSYN) 500 MG tablet Take 1 tablet (500 mg total) by mouth 2 (two) times daily for 4 days. 12/11/21 12/15/21 Yes Kambre Messner, Adora Fridge, PA-C  albuterol (VENTOLIN HFA) 108 (90 Base) MCG/ACT inhaler Inhale 1-2 puffs into the lungs every 6 (six) hours as needed for wheezing or shortness of breath. 07/10/21   Hazel Sams, PA-C  amLODipine (NORVASC) 10 MG tablet Take 10 mg by mouth daily. 09/24/19   [provider]  benzonatate (TESSALON) 100 MG capsule Take 1 capsule (100 mg total) by mouth every 8 (eight) hours. 07/10/21   Hazel Sams, PA-C  clindamycin (CLEOCIN) 300 MG capsule Take 1 capsule (300 mg total) by mouth 3 (three) times daily. 03/01/21   Raspet, Erin  K, PA-C  EUTHYROX 25 MCG tablet Take 25 mcg by mouth daily. 09/24/19   [provider]  levothyroxine (SYNTHROID) 25 MCG tablet Take 1 tab by mouth daily for thyroid Oral Once a day for 90 days 07/01/19   [provider]  lisinopril (ZESTRIL) 20 MG tablet Take 20 mg by mouth daily. 09/24/19   [provider]  rosuvastatin (CRESTOR) 20 MG tablet Take 20 mg by mouth at bedtime. 10/21/19   [provider]      Allergies    Penicillins and Naproxen    Review of Systems   Review of Systems  Musculoskeletal:  Positive for back pain.  All other systems reviewed and are negative.   Physical Exam Updated Vital Signs BP (!) 165/69 (BP Location: Left Arm)   Pulse (!) 55   Temp 98.1 F (36.7 C)   Resp 17   Ht 5\' 6"  (1.676 m)   Wt 81.2 kg   LMP  (LMP Unknown)   SpO2 99%   BMI 28.89 kg/m  Physical Exam Vitals and nursing note reviewed.  Constitutional:      General: She is not in acute distress.    Appearance: Normal appearance. She is not ill-appearing, toxic-appearing or diaphoretic.  HENT:     Head: Normocephalic and atraumatic.     Nose: No nasal deformity.     Mouth/Throat:     Lips: Pink.  No lesions.     Mouth: Mucous membranes are moist. No injury, lacerations, oral lesions or angioedema.     Pharynx: Oropharynx is clear. Uvula midline. No pharyngeal swelling, oropharyngeal exudate, posterior oropharyngeal erythema or uvula swelling.  Eyes:     General: Gaze aligned appropriately. No scleral icterus.       Right eye: No discharge.        Left eye: No discharge.     Conjunctiva/sclera: Conjunctivae normal.     Right eye: Right conjunctiva is not injected. No exudate or hemorrhage.    Left eye: Left conjunctiva is not injected. No exudate or hemorrhage. Cardiovascular:     Rate and Rhythm: Normal rate and regular rhythm.     Pulses: Normal pulses.          Radial pulses are 2+ on the right side and 2+ on the left side.       Dorsalis pedis  pulses are 2+ on the right side and 2+ on the left side.     Heart sounds: Normal heart sounds, S1 normal and S2 normal. Heart sounds not distant. No murmur heard.    No friction rub. No gallop. No S3 or S4 sounds.  Pulmonary:     Effort: Pulmonary effort is normal. No accessory muscle usage or respiratory distress.     Breath sounds: Normal breath sounds. No stridor. No wheezing, rhonchi or rales.  Chest:     Chest wall: No tenderness.  Abdominal:     General: Abdomen is flat. There is no distension.     Palpations: Abdomen is soft. There is no mass or pulsatile mass.     Tenderness: There is no abdominal tenderness. There is no guarding or rebound.  Musculoskeletal:     Right lower leg: No edema.     Left lower leg: No edema.     Comments: No cervical tenderness. Back pain not reproducible. No signs of trauma  Skin:    General: Skin is warm and dry.     Coloration: Skin is not jaundiced or pale.     Findings: No bruising, erythema, lesion or rash.  Neurological:     General: No focal deficit present.     Mental Status: She is alert and oriented to person, place, and time.     GCS: GCS eye subscore is 4. GCS verbal subscore is 5. GCS motor subscore is 6.  Psychiatric:        Mood and Affect: Mood normal.        Behavior: Behavior normal. Behavior is cooperative.     ED Results / Procedures / Treatments   Labs (all labs ordered are listed, but only abnormal results are displayed) Labs Reviewed  BASIC METABOLIC PANEL - Abnormal; Notable for the following components:      Result Value   Glucose, Bld 105 (*)    All other components within normal limits  RESP PANEL BY RT-PCR (FLU A&B, COVID) ARPGX2  CBC WITH DIFFERENTIAL/PLATELET  TROPONIN I (HIGH SENSITIVITY)    EKG EKG Interpretation  Date/Time:  Sunday December 11 2021 09:13:20 EST Ventricular Rate:  59 PR Interval:  162 QRS Duration: 78 QT Interval:  404 QTC Calculation: 399 R Axis:   20 Text Interpretation: Sinus  bradycardia Otherwise normal ECG No previous ECGs available Confirmed by Lacretia Leigh ([id number]) on 12/11/2021 2:05:29 PM  Radiology CT Angio Chest PE W and/or Wo Contrast  Result Date: 12/11/2021 CLINICAL DATA:  Elevated D-dimer. Clinical concern for pulmonary embolism.  EXAM: CT ANGIOGRAPHY CHEST WITH CONTRAST TECHNIQUE: Multidetector CT imaging of the chest was performed using the standard protocol during bolus administration of intravenous contrast. Multiplanar CT image reconstructions and MIPs were obtained to evaluate the vascular anatomy. RADIATION DOSE REDUCTION: This exam was performed according to the departmental dose-optimization program which includes automated exposure control, adjustment of the mA and/or kV according to patient size and/or use of iterative reconstruction technique. CONTRAST:  71mL OMNIPAQUE IOHEXOL 350 MG/ML SOLN COMPARISON:  None Available. FINDINGS: Cardiovascular: Normally opacified pulmonary arteries with no pulmonary arterial filling defects seen. Enlarged heart. Enlarged central pulmonary arteries with a main pulmonary artery diameter of 3.2 cm. Atheromatous calcifications, including the coronary arteries and aorta. Mediastinum/Nodes: No enlarged mediastinal, hilar, or axillary lymph nodes. Thyroid gland, trachea, and esophagus demonstrate no significant findings. Small hiatal hernia or mildly prominent esophageal ampulla. Lungs/Pleura: Mild linear atelectasis or scarring in both lower lobes and left upper lobe. Pleural fluid or pneumothorax. Upper Abdomen: Unremarkable. Musculoskeletal: Mild thoracic and lower cervical spine degenerative changes. Review of the MIP images confirms the above findings. IMPRESSION: 1. No pulmonary emboli or acute abnormality. 2. Cardiomegaly. 3. Enlarged central pulmonary arteries, compatible with pulmonary arterial hypertension. 4. Calcific coronary artery and aortic atherosclerosis. 5. Small hiatal hernia or mildly prominent esophageal ampulla.  6. Mild linear atelectasis or scarring in both lower lobes and left upper lobe. Aortic Atherosclerosis (ICD10-I70.0). Electronically Signed   By: Claudie Revering M.D.   On: 12/11/2021 12:15    Procedures Procedures  This patient was on telemetry or cardiac monitoring during their time in the ED.    Medications Ordered in ED Medications  iohexol (OMNIPAQUE) 350 MG/ML injection 75 mL (75 mLs Intravenous Contrast Given 12/11/21 1203)    ED Course/ Medical Decision Making/ A&P                           Medical Decision Making Risk Prescription drug management.    MDM  This is a 71 y.o. female who presents to the ED with back pain The differential of this patient includes but is not limited to  PE, Pyelonephritis, MSK, bronchitis, ACS, PTX   Initial Impression  Well appearing and in no acute distress Afebrile and has normal vitals Exam unremarkable  I personally ordered, reviewed, and interpreted all laboratory work and imaging and agree with radiologist interpretation. Results interpreted below:  Labs are without leukocytosis or anemia. Renal function normal. Electrolytes are okay. Resp viral panel negative. Troponin normal. EKG normal and nonischemic.   CTA chest: negative for PE. No sign of PNA. Cardiomegaly and possible pulmonary hypertension noted.   Assessment/Plan:  Workup without PE. Doubt ACS with normal EKG and negative troponin. Possible pulmonary HTN noted on CT, but patient has no shortness of breath or signs of fluid overload. This is likely incidental and can be worked up outpatient. Pain is thought to be musculoskeletal. I have discussed Tylenol use at home as well as limited amt of NSAIDs.  She has no admission needs today. She should f/u with PCP.   Charting Requirements Additional history is obtained from:  Independent historian External Records from outside source obtained and reviewed including: Recent outpatient note Social Determinants of Health:   none Pertinant PMH that complicates patient's illness: HTN, HLD  Patient Care Problems that were addressed during this visit: - Upper back pain on right side: Acute illness This patient was maintained on a cardiac monitor/telemetry. I personally viewed and interpreted  the cardiac monitor which reveals an underlying rhythm of NSR Medications given in ED: n/a Reevaluation of the patient after these medicines showed that the patient improved I have reviewed home medications and made changes accordingly.  Critical Care Interventions: n/a Consultations: n/a Disposition: discharge   Portions of this note were generated with Dragon dictation software. Dictation errors may occur despite best attempts at proofreading.     Final Clinical Impression(s) / ED Diagnoses Final diagnoses:  Upper back pain on right side    Rx / DC Orders ED Discharge Orders          Ordered    naproxen (NAPROSYN) 500 MG tablet  2 times daily        12/11/21 1435              Randolf Sansoucie, Adora Fridge, PA-C 12/11/21 1448    Lacretia Leigh, MD 12/12/21 [phone]

## 2021-12-11 NOTE — Discharge Instructions (Addendum)
You were seen in the ED for right sided upper back pain. We obtained a CT scan to rule out a blood clot and this was normal. We also obtained cardiac markers that were normal as well. This is unlikely to be a heart attack. At this point, we do not find any emergent cause to your symptoms today. I have prescribed you 4 days of Naproxen to take for your pain. You should not take this any longer than 4 days to avoid any stomach issues. Please contact your PCP to schedule a follow up appointment.   Please return if you develop shortness of breath, leg swelling, or other concerning symptoms.

## 2022-03-15 ENCOUNTER — Other Ambulatory Visit: Payer: Self-pay | Admitting: Internal Medicine

## 2022-03-15 DIAGNOSIS — Z0000 Encounter for general adult medical examination without abnormal findings: Secondary | ICD-10-CM

## 2022-03-21 ENCOUNTER — Other Ambulatory Visit: Payer: Self-pay | Admitting: Internal Medicine

## 2022-03-21 DIAGNOSIS — R011 Cardiac murmur, unspecified: Secondary | ICD-10-CM

## 2022-03-28 ENCOUNTER — Ambulatory Visit: Payer: 59

## 2022-03-28 DIAGNOSIS — R011 Cardiac murmur, unspecified: Secondary | ICD-10-CM

## 2022-04-11 ENCOUNTER — Ambulatory Visit: Payer: 59 | Admitting: Cardiology

## 2022-04-11 NOTE — Progress Notes (Unsigned)
Patient referred by Caroline Boston, MD for aortic stenosis  Subjective:   Caroline Mcconnell, female    DOB: 06/21/1950, 72 y.o.   MRN: [id number]   Chief Complaint  Patient presents with   Aortic Stenosis   New Patient (Initial Visit)     HPI  73 y.o. African American female with hypertension, hyperlipidemia, former smoker, arthritis, referred for evaluation of aortic stenosis  Patient lives with granddaughter Caroline Mcconnell, who is on over the phone for today's appointment.  Patient is fairly functional in spite of her being arthritis in the knees, as well as elbows requiring arthrocentesis.  Her activity is most limited by arthritis.  In spite of that, she walks using a walker, and gets around with difficulty.  She goes for 30-minute walk every day, but covers only about half a mile distance at this time.  With this level of activity she denies any complaints of chest pain, shortness of breath.  She also denies any orthopnea, PND, leg edema, presyncope, syncope symptoms   Past Medical History:  Diagnosis Date   Arthritis    Hyperlipidemia    Hypertension    Sciatica    Scoliosis    Thyroid disease      Past Surgical History:  Procedure Laterality Date   GANGLION CYST EXCISION Right    TUBAL LIGATION       Social History   Tobacco Use  Smoking Status Every Day   Packs/day: 0.23   Types: Cigarettes  Smokeless Tobacco Never    Social History   Substance and Sexual Activity  Alcohol Use Never     Family History  Family history unknown: Yes      Current Outpatient Medications:    albuterol (VENTOLIN HFA) 108 (90 Base) MCG/ACT inhaler, Inhale 1-2 puffs into the lungs every 6 (six) hours as needed for wheezing or shortness of breath., Disp: 1 each, Rfl: 0   amLODipine (NORVASC) 10 MG tablet, Take 10 mg by mouth daily., Disp: , Rfl:    benzonatate (TESSALON) 100 MG capsule, Take 1 capsule (100 mg total) by mouth every 8 (eight) hours., Disp: 21 capsule, Rfl: 0    clindamycin (CLEOCIN) 300 MG capsule, Take 1 capsule (300 mg total) by mouth 3 (three) times daily., Disp: 30 capsule, Rfl: 0   EUTHYROX 25 MCG tablet, Take 25 mcg by mouth daily., Disp: , Rfl:    levothyroxine (SYNTHROID) 25 MCG tablet, Take 1 tab by mouth daily for thyroid Oral Once a day for 90 days, Disp: , Rfl:    lisinopril (ZESTRIL) 20 MG tablet, Take 20 mg by mouth daily., Disp: , Rfl:    rosuvastatin (CRESTOR) 20 MG tablet, Take 20 mg by mouth at bedtime., Disp: , Rfl:    Cardiovascular and other pertinent studies:  Reviewed external labs and tests, independently interpreted  EKG 04/12/2022: Sinus rhythm 80 bpm  Old anteroseptal infarct  Echocardiogram 03/28/2022:  Normal LV systolic function with visual EF 55-60%. Left ventricle cavity  is normal in size. Moderate concentric hypertrophy of the left ventricle.  Normal global wall motion. Doppler evidence of grade II (pseudonormal)  diastolic dysfunction, elevated LAP. Calculated EF 57%.  Left atrial cavity is slightly dilated.  Native aortic valve. Moderate (by valve area) to severe (by gradients)  aortic stenosis. Moderate (Grade III) aortic regurgitation. Severe aortic  valve leaflet calcification. AVA (VTI) measures 1.3 cm^2. AV Mean Grad  measures 34.8 mmHg. AV Pk Vel measures 4.41 m/s.  Structurally normal mitral valve.  Mild (Grade I) mitral regurgitation.  Structurally normal tricuspid valve.  Mild tricuspid regurgitation. No  evidence of pulmonary hypertension. RVSP measures 32 mmHg.  No prior available for comparison.   CT chest 12/11/2021: 1. No pulmonary emboli or acute abnormality. 2. Cardiomegaly. 3. Enlarged central pulmonary arteries, compatible with pulmonary arterial hypertension. 4. Calcific coronary artery and aortic atherosclerosis. 5. Small hiatal hernia or mildly prominent esophageal ampulla. 6. Mild linear atelectasis or scarring in both lower lobes and left upper lobe.   Aortic  Atherosclerosis   Recent labs: 12/11/2021: Glucose 105, BUN/Cr 14/0.79. EGFR >60. Na/K 139/3.8.  H/H 14/45. MCV 92. Platelets 339  08/2021: HbA1C 6.0% Chol 232, TG 104, HDL 73, LDL 138 TSH 2.1 normal Trop HS 14    Review of Systems  Cardiovascular:  Negative for chest pain, dyspnea on exertion, leg swelling, orthopnea, palpitations and syncope.  Musculoskeletal:  Positive for joint pain.         Vitals:   04/12/22 0939 04/12/22 0958  BP: (!) 149/77 128/63  Pulse: 83 74     Body mass index is 33.09 kg/m. Filed Weights   04/12/22 0939  Weight: 205 lb (93 kg)     Objective:   Physical Exam Vitals and nursing note reviewed.  Constitutional:      General: She is not in acute distress. Neck:     Vascular: No JVD.  Cardiovascular:     Rate and Rhythm: Normal rate and regular rhythm.     Heart sounds: Murmur heard.     Harsh midsystolic murmur is present with a grade of 3/6 at the upper right sternal border radiating to the neck.     High-pitched blowing decrescendo early diastolic murmur is present with a grade of 2/4 at the upper right sternal border radiating to the apex.  Pulmonary:     Effort: Pulmonary effort is normal.     Breath sounds: Normal breath sounds. No wheezing or rales.  Musculoskeletal:     Right lower leg: No edema.     Left lower leg: No edema.         Visit diagnoses:   ICD-10-CM   1. Nonrheumatic aortic valve stenosis  I35.0 EKG 12-Lead    B Nat Peptide    2. Nonrheumatic aortic valve insufficiency  I35.1        Orders Placed This Encounter  Procedures   B Nat Peptide   EKG 12-Lead      Assessment & Recommendations:   72 y/o [place] American female with hypertension, hyperlipidemia, former smoker, arthritis, referred for evaluation of aortic stenosis  Aortic stenosis/aortic regurgitation: I personally reviewed CT chest 12/2021, and echocardiogram 03/2022. Severely calcified aortic valve, probably trileaflet. I personally  reviewed echocardiogram dated 03/28/2022.  She has increased LVOT gradient, as well as gradients through the aortic valve, giving her dimensionless index of 0.3.  V-max is 4.7 m/sec, mean PG 40 mmHg, AVA 1.1 cm. Increased gradients could partially be due to at least moderate aortic regurgitation. Overall, I believe she has moderate to severe AAS, and moderate AI.  With her level of activity, which is primarily limited to arthritis, she has no symptoms at this time.  I will check BNP to her further risk stratification. I discussed the natural history of aortic stenosis with the patient, and explained to her that she will very likely need aortic valve replacement in her lifetime. I will see her back in 2 months for follow up and repeat an echocardiogram, with strain imaging,  in 09/2022.    Thank you for referring the patient to [place]. Please feel free to contact with any questions.   Nigel Mormon, MD Pager: [phone] Office: [phone]

## 2022-04-12 ENCOUNTER — Encounter: Payer: Self-pay | Admitting: Cardiology

## 2022-04-12 ENCOUNTER — Ambulatory Visit: Payer: 59 | Admitting: Cardiology

## 2022-04-12 VITALS — BP 128/63 | HR 74 | Ht 66.0 in | Wt 205.0 lb

## 2022-04-12 DIAGNOSIS — I350 Nonrheumatic aortic (valve) stenosis: Secondary | ICD-10-CM | POA: Insufficient documentation

## 2022-04-12 DIAGNOSIS — I351 Nonrheumatic aortic (valve) insufficiency: Secondary | ICD-10-CM | POA: Insufficient documentation

## 2022-05-25 LAB — BRAIN NATRIURETIC PEPTIDE: BNP: 14 pg/mL (ref 0.0–100.0)

## 2022-06-15 ENCOUNTER — Encounter: Payer: 59 | Admitting: Cardiology

## 2022-06-15 NOTE — Progress Notes (Deleted)
Patient referred by [name], MD for aortic stenosis  Subjective:   Caroline Mcconnell, female    DOB: Feb 27, 1950, 72 y.o.   MRN: [id number]   No chief complaint on file.    HPI  72 y.o. African American female with hypertension, hyperlipidemia, former smoker, arthritis, referred for evaluation of aortic stenosis  ***  Initial consultation visit 04/2022: Patient lives with granddaughter [name], who is on over the phone for today's appointment.  Patient is fairly functional in spite of her being arthritis in the knees, as well as elbows requiring arthrocentesis.  Her activity is most limited by arthritis.  In spite of that, she walks using a walker, and gets around with difficulty.  She goes for 30-minute walk every day, but covers only about half a mile distance at this time.  With this level of activity she denies any complaints of chest pain, shortness of breath.  She also denies any orthopnea, PND, leg edema, presyncope, syncope symptoms    Current Outpatient Medications:    amLODipine (NORVASC) 10 MG tablet, Take 10 mg by mouth daily., Disp: , Rfl:    atorvastatin (LIPITOR) 40 MG tablet, Take 40 mg by mouth daily., Disp: , Rfl:    ezetimibe (ZETIA) 10 MG tablet, Take 10 mg by mouth daily., Disp: , Rfl:    levothyroxine (SYNTHROID) 25 MCG tablet, Take 1 tab by mouth daily for thyroid Oral Once a day for 90 days, Disp: , Rfl:    lisinopril (ZESTRIL) 20 MG tablet, Take 20 mg by mouth daily., Disp: , Rfl:    naproxen (NAPROSYN) 375 MG tablet, Take 1 tablet by mouth 2 (two) times daily., Disp: , Rfl:    Cardiovascular and other pertinent studies:  Reviewed external labs and tests, independently interpreted  EKG 04/12/2022: Sinus rhythm 80 bpm  Old anteroseptal infarct  Echocardiogram 03/28/2022:  Normal LV systolic function with visual EF 55-60%. Left ventricle cavity  is normal in size. Moderate concentric hypertrophy of the left ventricle.  Normal global wall motion.  Doppler evidence of grade II (pseudonormal)  diastolic dysfunction, elevated LAP. Calculated EF 57%.  Left atrial cavity is slightly dilated.  Native aortic valve. Moderate (by valve area) to severe (by gradients)  aortic stenosis. Moderate (Grade III) aortic regurgitation. Severe aortic  valve leaflet calcification. AVA (VTI) measures 1.3 cm^2. AV Mean Grad  measures 34.8 mmHg. AV Pk Vel measures 4.41 m/s.  Structurally normal mitral valve.  Mild (Grade I) mitral regurgitation.  Structurally normal tricuspid valve.  Mild tricuspid regurgitation. No  evidence of pulmonary hypertension. RVSP measures 32 mmHg.  No prior available for comparison.   CT chest 12/11/2021: 1. No pulmonary emboli or acute abnormality. 2. Cardiomegaly. 3. Enlarged central pulmonary arteries, compatible with pulmonary arterial hypertension. 4. Calcific coronary artery and aortic atherosclerosis. 5. Small hiatal hernia or mildly prominent esophageal ampulla. 6. Mild linear atelectasis or scarring in both lower lobes and left upper lobe.   Aortic Atherosclerosis   Recent labs: 12/11/2021: Glucose 105, BUN/Cr 14/0.79. EGFR >60. Na/K 139/3.8.  H/H 14/45. MCV 92. Platelets 339  08/2021: HbA1C 6.0% Chol 232, TG 104, HDL 73, LDL 138 TSH 2.1 normal Trop HS 14    Review of Systems  Cardiovascular:  Negative for chest pain, dyspnea on exertion, leg swelling, orthopnea, palpitations and syncope.  Musculoskeletal:  Positive for joint pain.         There were no vitals filed for this visit.    There is no height or  weight on file to calculate BMI. There were no vitals filed for this visit.    Objective:   Physical Exam Vitals and nursing note reviewed.  Constitutional:      General: She is not in acute distress. Neck:     Vascular: No JVD.  Cardiovascular:     Rate and Rhythm: Normal rate and regular rhythm.     Heart sounds: Murmur heard.     Harsh midsystolic murmur is present with a grade of  3/6 at the upper right sternal border radiating to the neck.     High-pitched blowing decrescendo early diastolic murmur is present with a grade of 2/4 at the upper right sternal border radiating to the apex.  Pulmonary:     Effort: Pulmonary effort is normal.     Breath sounds: Normal breath sounds. No wheezing or rales.  Musculoskeletal:     Right lower leg: No edema.     Left lower leg: No edema.         Visit diagnoses: No diagnosis found.    No orders of the defined types were placed in this encounter.     Assessment & Recommendations:   72 y/o [place] American female with hypertension, hyperlipidemia, former smoker, arthritis, referred for evaluation of aortic stenosis  *** Aortic stenosis/aortic regurgitation: I personally reviewed CT chest 12/2021, and echocardiogram 03/2022. Severely calcified aortic valve, probably trileaflet. I personally reviewed echocardiogram dated 03/28/2022.  She has increased LVOT gradient, as well as gradients through the aortic valve, giving her dimensionless index of 0.3.  V-max is 4.7 m/sec, mean PG 40 mmHg, AVA 1.1 cm. Increased gradients could partially be due to at least moderate aortic regurgitation. Overall, I believe she has moderate to severe AS, and moderate AI.  With her level of activity, which is primarily limited to arthritis, she has no symptoms at this time.  I will check BNP to her further risk stratification. I discussed the natural history of aortic stenosis with the patient, and explained to her that she will very likely need aortic valve replacement in her lifetime. I will see her back in 2 months for follow up and repeat an echocardiogram, with strain imaging, in 09/2022.    Thank you for referring the patient to [place]. Please feel free to contact with any questions.   Elder Negus, MD Pager: [phone] Office: [phone]

## 2022-06-19 ENCOUNTER — Ambulatory Visit: Payer: 59 | Admitting: Cardiology

## 2022-06-19 ENCOUNTER — Encounter: Payer: Self-pay | Admitting: Cardiology

## 2022-06-19 VITALS — BP 127/68 | HR 76 | Resp 16 | Ht 66.0 in | Wt 207.0 lb

## 2022-06-19 DIAGNOSIS — I350 Nonrheumatic aortic (valve) stenosis: Secondary | ICD-10-CM

## 2022-06-19 DIAGNOSIS — I10 Essential (primary) hypertension: Secondary | ICD-10-CM | POA: Insufficient documentation

## 2022-06-19 DIAGNOSIS — I351 Nonrheumatic aortic (valve) insufficiency: Secondary | ICD-10-CM

## 2022-06-19 MED ORDER — LISINOPRIL 10 MG PO TABS: 10.0000 mg | ORAL_TABLET | Freq: Every day | ORAL | 3 refills | Status: DC

## 2022-06-19 NOTE — Progress Notes (Signed)
Patient referred by Caroline Quitter, MD for aortic stenosis  Subjective:   Caroline Mcconnell, female    DOB: December 30, 1950, 72 y.o.   MRN: [id number]   Chief Complaint  Patient presents with   Nonrheumatic aortic valve stenosis   Follow-up     HPI  72 y.o. African American female with hypertension, hyperlipidemia, former smoker, arthritis, referred for evaluation of aortic stenosis  Patient is doing well.  She tells me that she is feeling "stronger", since addition of ezetimibe.  She is able to walk 4 blocks without any symptoms of chest pain, shortness of breath.  She also denies orthopnea, PND, leg edema, presyncope, syncope symptoms.  Initial consultation visit 04/2022: Patient lives with granddaughter [name], who is on over the phone for today's appointment.  Patient is fairly functional in spite of her being arthritis in the knees, as well as elbows requiring arthrocentesis.  Her activity is most limited by arthritis.  In spite of that, she walks using a walker, and gets around with difficulty.  She goes for 30-minute walk every day, but covers only about half a mile distance at this time.  With this level of activity she denies any complaints of chest pain, shortness of breath.  She also denies any orthopnea, PND, leg edema, presyncope, syncope symptoms    Current Outpatient Medications:    amLODipine (NORVASC) 10 MG tablet, Take 10 mg by mouth daily., Disp: , Rfl:    atorvastatin (LIPITOR) 40 MG tablet, Take 40 mg by mouth daily., Disp: , Rfl:    ezetimibe (ZETIA) 10 MG tablet, Take 10 mg by mouth daily., Disp: , Rfl:    levothyroxine (SYNTHROID) 25 MCG tablet, Take 1 tab by mouth daily for thyroid Oral Once a day for 90 days, Disp: , Rfl:    lisinopril (ZESTRIL) 20 MG tablet, Take 20 mg by mouth daily., Disp: , Rfl:    naproxen (NAPROSYN) 375 MG tablet, Take 1 tablet by mouth 2 (two) times daily., Disp: , Rfl:    Cardiovascular and other pertinent studies:  Reviewed external  labs and tests, independently interpreted  EKG 04/12/2022: Sinus rhythm 80 bpm  Old anteroseptal infarct  Echocardiogram 03/28/2022:  Normal LV systolic function with visual EF 55-60%. Left ventricle cavity  is normal in size. Moderate concentric hypertrophy of the left ventricle.  Normal global wall motion. Doppler evidence of grade II (pseudonormal)  diastolic dysfunction, elevated LAP. Calculated EF 57%.  Left atrial cavity is slightly dilated.  Native aortic valve. Moderate (by valve area) to severe (by gradients)  aortic stenosis. Moderate (Grade III) aortic regurgitation. Severe aortic  valve leaflet calcification. AVA (VTI) measures 1.3 cm^2. AV Mean Grad  measures 34.8 mmHg. AV Pk Vel measures 4.41 m/s.  Structurally normal mitral valve.  Mild (Grade I) mitral regurgitation.  Structurally normal tricuspid valve.  Mild tricuspid regurgitation. No  evidence of pulmonary hypertension. RVSP measures 32 mmHg.  No prior available for comparison.   CT chest 12/11/2021: 1. No pulmonary emboli or acute abnormality. 2. Cardiomegaly. 3. Enlarged central pulmonary arteries, compatible with pulmonary arterial hypertension. 4. Calcific coronary artery and aortic atherosclerosis. 5. Small hiatal hernia or mildly prominent esophageal ampulla. 6. Mild linear atelectasis or scarring in both lower lobes and left upper lobe.   Aortic Atherosclerosis   Recent labs: 12/11/2021: Glucose 105, BUN/Cr 14/0.79. EGFR >60. Na/K 139/3.8.  H/H 14/45. MCV 92. Platelets 339  08/2021: HbA1C 6.0% Chol 232, TG 104, HDL 73, LDL 138 TSH 2.1 normal Trop  HS 14    Review of Systems  Cardiovascular:  Negative for chest pain, dyspnea on exertion, leg swelling, orthopnea, palpitations and syncope.  Musculoskeletal:  Positive for joint pain.         Vitals:   06/19/22 0904  BP: 127/68  Pulse: 76  Resp: 16  SpO2: 96%     Body mass index is 33.41 kg/m. Filed Weights   06/19/22 0904  Weight:  207 lb (93.9 kg)     Objective:   Physical Exam Vitals and nursing note reviewed.  Constitutional:      General: She is not in acute distress. Neck:     Vascular: No JVD.  Cardiovascular:     Rate and Rhythm: Normal rate and regular rhythm.     Heart sounds: Murmur heard.     Harsh midsystolic murmur is present with a grade of 3/6 at the upper right sternal border radiating to the neck.     High-pitched blowing decrescendo early diastolic murmur is present with a grade of 2/4 at the upper right sternal border radiating to the apex.  Pulmonary:     Effort: Pulmonary effort is normal.     Breath sounds: Normal breath sounds. No wheezing or rales.  Musculoskeletal:     Right lower leg: No edema.     Left lower leg: No edema.         Visit diagnoses:   ICD-10-CM   1. Nonrheumatic aortic valve stenosis  I35.0     2. Nonrheumatic aortic valve insufficiency  I35.1        No orders of the defined types were placed in this encounter.     Assessment & Recommendations:   72 y/o [place] American female with hypertension, hyperlipidemia, former smoker, arthritis, referred for evaluation of aortic stenosis   Aortic stenosis/aortic regurgitation: I personally reviewed CT chest 12/2021, and echocardiogram 03/2022. Severely calcified aortic valve, probably trileaflet. I personally reviewed echocardiogram dated 03/28/2022.  She has increased LVOT gradient, as well as gradients through the aortic valve, giving her dimensionless index of 0.3.  V-max is 4.7 m/sec, mean PG 40 mmHg, AVA 1.1 cm. Increased gradients could partially be due to at least moderate aortic regurgitation. Overall, I believe she has moderate to severe AS, and moderate AI.  With her level of activity, which is primarily limited to arthritis, she has no symptoms at this time. BNP was normal in 05/2022. I again discussed the natural history of aortic stenosis with the patient, and explained to her that she will very likely  need aortic valve replacement in her lifetime.  However, there is no immediate need for valve replacement at this time. Will repeat echocardiogram in 09/2022.  Hypertension: With blood pressure normal and possibly likely to improve with increased walking and weight loss, reduce lisinopril to 10 mg daily to avoid any hypotension in the setting of severe aortic stenosis.  Mixed hyperlipidemia: Continue statin and ezetimibe.  Will check lipid panel in 09/2022.  F/u in 3 months Thank you for referring the patient to [place]. Please feel free to contact with any questions.   Elder Negus, MD Pager: [phone] Office: [phone]

## 2022-07-20 ENCOUNTER — Ambulatory Visit: Payer: 59

## 2022-07-20 DIAGNOSIS — I350 Nonrheumatic aortic (valve) stenosis: Secondary | ICD-10-CM

## 2022-07-20 DIAGNOSIS — I351 Nonrheumatic aortic (valve) insufficiency: Secondary | ICD-10-CM

## 2022-07-24 NOTE — Progress Notes (Signed)
Patient aware.

## 2022-08-25 ENCOUNTER — Other Ambulatory Visit (HOSPITAL_COMMUNITY): Payer: Self-pay | Admitting: Cardiology

## 2022-08-26 LAB — BASIC METABOLIC PANEL
BUN/Creatinine Ratio: 13 (ref 12–28)
BUN: 11 mg/dL (ref 8–27)
CO2: 22 mmol/L (ref 20–29)
Calcium: 9.9 mg/dL (ref 8.7–10.3)
Chloride: 101 mmol/L (ref 96–106)
Creatinine, Ser: 0.82 mg/dL (ref 0.57–1.00)
Glucose: 105 mg/dL — ABNORMAL HIGH (ref 70–99)
Potassium: 4 mmol/L (ref 3.5–5.2)
Sodium: 140 mmol/L (ref 134–144)
eGFR: 75 mL/min/{1.73_m2} (ref >59)

## 2022-08-26 LAB — LIPID PANEL W/O CHOL/HDL RATIO
Cholesterol, Total: 192 mg/dL (ref 100–199)
HDL: 65 mg/dL (ref >39)
LDL Chol Calc (NIH): 111 mg/dL — ABNORMAL HIGH (ref 0–99)
Triglycerides: 86 mg/dL (ref 0–149)
VLDL Cholesterol Cal: 16 mg/dL (ref 5–40)

## 2022-08-26 LAB — BRAIN NATRIURETIC PEPTIDE: BNP: 22.7 pg/mL (ref 0.0–100.0)

## 2022-09-13 ENCOUNTER — Ambulatory Visit: Payer: 59 | Admitting: Cardiology

## 2022-09-25 ENCOUNTER — Encounter: Payer: 59 | Admitting: Cardiology

## 2022-09-25 NOTE — Progress Notes (Deleted)
Patient referred by Caroline Quitter, MD for aortic stenosis  Subjective:   Caroline Mcconnell, female    DOB: 12/09/1950, 72 y.o.   MRN: [id number]   No chief complaint on file.    HPI  72 y.o. African American female with hypertension, hyperlipidemia, former smoker, arthritis, referred for evaluation of aortic stenosis  *** Patient is doing well.  She tells me that she is feeling "stronger", since addition of ezetimibe.  She is able to walk 4 blocks without any symptoms of chest pain, shortness of breath.  She also denies orthopnea, PND, leg edema, presyncope, syncope symptoms.  Initial consultation visit 04/2022: Patient lives with granddaughter Caroline Mcconnell, who is on over the phone for today's appointment.  Patient is fairly functional in spite of her being arthritis in the knees, as well as elbows requiring arthrocentesis.  Her activity is most limited by arthritis.  In spite of that, she walks using a walker, and gets around with difficulty.  She goes for 30-minute walk every day, but covers only about half a mile distance at this time.  With this level of activity she denies any complaints of chest pain, shortness of breath.  She also denies any orthopnea, PND, leg edema, presyncope, syncope symptoms    Current Outpatient Medications:    amLODipine (NORVASC) 10 MG tablet, Take 10 mg by mouth daily., Disp: , Rfl:    aspirin EC (BAYER ASPIRIN EC LOW DOSE) 81 MG tablet, Take 81 mg by mouth once., Disp: , Rfl:    atorvastatin (LIPITOR) 40 MG tablet, Take 40 mg by mouth daily., Disp: , Rfl:    ezetimibe (ZETIA) 10 MG tablet, Take 10 mg by mouth daily., Disp: , Rfl:    levothyroxine (SYNTHROID) 25 MCG tablet, Take 1 tab by mouth daily for thyroid Oral Once a day for 90 days, Disp: , Rfl:    lisinopril (ZESTRIL) 10 MG tablet, Take 1 tablet (10 mg total) by mouth daily., Disp: 90 tablet, Rfl: 3   meloxicam (MOBIC) 15 MG tablet, Take 1 tablet by mouth daily., Disp: , Rfl:    methocarbamol  (ROBAXIN) 500 MG tablet, Take 1 tablet by mouth every 8 (eight) hours., Disp: , Rfl:    naproxen (NAPROSYN) 375 MG tablet, Take 1 tablet by mouth 2 (two) times daily., Disp: , Rfl:    Cardiovascular and other pertinent studies:  Reviewed external labs and tests, independently interpreted  EKG 04/12/2022: Sinus rhythm 80 bpm  Old anteroseptal infarct  Echocardiogram 03/28/2022:  Normal LV systolic function with visual EF 55-60%. Left ventricle cavity  is normal in size. Moderate concentric hypertrophy of the left ventricle.  Normal global wall motion. Doppler evidence of grade II (pseudonormal)  diastolic dysfunction, elevated LAP. Calculated EF 57%.  Left atrial cavity is slightly dilated.  Native aortic valve. Moderate (by valve area) to severe (by gradients)  aortic stenosis. Moderate (Grade III) aortic regurgitation. Severe aortic  valve leaflet calcification. AVA (VTI) measures 1.3 cm^2. AV Mean Grad  measures 34.8 mmHg. AV Pk Vel measures 4.41 m/s.  Structurally normal mitral valve.  Mild (Grade I) mitral regurgitation.  Structurally normal tricuspid valve.  Mild tricuspid regurgitation. No  evidence of pulmonary hypertension. RVSP measures 32 mmHg.  No prior available for comparison.   CT chest 12/11/2021: 1. No pulmonary emboli or acute abnormality. 2. Cardiomegaly. 3. Enlarged central pulmonary arteries, compatible with pulmonary arterial hypertension. 4. Calcific coronary artery and aortic atherosclerosis. 5. Small hiatal hernia or mildly prominent esophageal ampulla. 6.  Mild linear atelectasis or scarring in both lower lobes and left upper lobe.   Aortic Atherosclerosis   Recent labs: 08/25/2022: Glucose 105, BUN/Cr 11/0.82. EGFR 75. Na/K 140/4.0.  H/H ***/***. MCV ***. Platelets *** ***HbA1C ***% Chol 192, TG 86, HDL 65, LDL 111 ***TSH ***normal  16/02/958: Glucose 105, BUN/Cr 14/0.79. EGFR >60. Na/K 139/3.8.  H/H 14/45. MCV 92. Platelets 339  08/2021: HbA1C  6.0% Chol 232, TG 104, HDL 73, LDL 138 TSH 2.1 normal Trop HS 14    Review of Systems  Cardiovascular:  Negative for chest pain, dyspnea on exertion, leg swelling, orthopnea, palpitations and syncope.  Musculoskeletal:  Positive for joint pain.         There were no vitals filed for this visit.    There is no height or weight on file to calculate BMI. There were no vitals filed for this visit.    Objective:   Physical Exam Vitals and nursing note reviewed.  Constitutional:      General: She is not in acute distress. Neck:     Vascular: No JVD.  Cardiovascular:     Rate and Rhythm: Normal rate and regular rhythm.     Heart sounds: Murmur heard.     Harsh midsystolic murmur is present with a grade of 3/6 at the upper right sternal border radiating to the neck.     High-pitched blowing decrescendo early diastolic murmur is present with a grade of 2/4 at the upper right sternal border radiating to the apex.  Pulmonary:     Effort: Pulmonary effort is normal.     Breath sounds: Normal breath sounds. No wheezing or rales.  Musculoskeletal:     Right lower leg: No edema.     Left lower leg: No edema.         Visit diagnoses: No diagnosis found.    No orders of the defined types were placed in this encounter.     Assessment & Recommendations:   72 y/o [place] American female with hypertension, hyperlipidemia, former smoker, arthritis, referred for evaluation of aortic stenosis  *** Aortic stenosis/aortic regurgitation: I personally reviewed CT chest 12/2021, and echocardiogram 03/2022. Severely calcified aortic valve, probably trileaflet. I personally reviewed echocardiogram dated 03/28/2022.  She has increased LVOT gradient, as well as gradients through the aortic valve, giving her dimensionless index of 0.3.  V-max is 4.7 m/sec, mean PG 40 mmHg, AVA 1.1 cm. Increased gradients could partially be due to at least moderate aortic regurgitation. Overall, I believe  she has moderate to severe AS, and moderate AI.  With her level of activity, which is primarily limited to arthritis, she has no symptoms at this time. BNP was normal in 05/2022. I again discussed the natural history of aortic stenosis with the patient, and explained to her that she will very likely need aortic valve replacement in her lifetime.  However, there is no immediate need for valve replacement at this time. Will repeat echocardiogram in 01/2023.  Hypertension: With blood pressure normal and possibly likely to improve with increased walking and weight loss, reduce lisinopril to 10 mg daily to avoid any hypotension in the setting of severe aortic stenosis.  Mixed hyperlipidemia: Continue statin and ezetimibe.  Will check lipid panel in 09/2022.  F/u in 3 months Thank you for referring the patient to [place]. Please feel free to contact with any questions.   Elder Negus, MD Pager: [phone] Office: [phone]

## 2022-10-04 ENCOUNTER — Encounter: Payer: Self-pay | Admitting: Cardiology

## 2022-10-04 ENCOUNTER — Ambulatory Visit: Payer: 59 | Admitting: Cardiology

## 2022-10-04 VITALS — BP 125/66 | HR 71 | Resp 16 | Ht 66.0 in | Wt 210.0 lb

## 2022-10-04 DIAGNOSIS — E782 Mixed hyperlipidemia: Secondary | ICD-10-CM

## 2022-10-04 DIAGNOSIS — I351 Nonrheumatic aortic (valve) insufficiency: Secondary | ICD-10-CM

## 2022-10-04 DIAGNOSIS — I10 Essential (primary) hypertension: Secondary | ICD-10-CM

## 2022-10-04 DIAGNOSIS — I350 Nonrheumatic aortic (valve) stenosis: Secondary | ICD-10-CM

## 2022-10-04 MED ORDER — ATORVASTATIN CALCIUM 80 MG PO TABS: 80.0000 mg | ORAL_TABLET | Freq: Every day | ORAL | 3 refills | Status: DC

## 2022-10-04 NOTE — Progress Notes (Signed)
Patient referred by [name], MD for aortic stenosis  Subjective:   Caroline Mcconnell, female    DOB: 1950-05-25, 72 y.o.   MRN: [id number]   Chief Complaint  Patient presents with   Nonrheumatic aortic valve stenosis   Follow-up     HPI  72 y.o. African American female with hypertension, hyperlipidemia, former smoker, arthritis, referred for evaluation of aortic stenosis  Patient is doing well.  She denies any chest pain, shortness of breath, leg edema symptoms.  Also denies any presyncope or syncope symptoms.  Reviewed recent echocardiogram and lipid panel results with the patient, details below.    Initial consultation visit 04/2022: Patient lives with granddaughter Caroline Mcconnell, who is on over the phone for today's appointment.  Patient is fairly functional in spite of her being arthritis in the knees, as well as elbows requiring arthrocentesis.  Her activity is most limited by arthritis.  In spite of that, she walks using a walker, and gets around with difficulty.  She goes for 30-minute walk every day, but covers only about half a mile distance at this time.  With this level of activity she denies any complaints of chest pain, shortness of breath.  She also denies any orthopnea, PND, leg edema, presyncope, syncope symptoms    Current Outpatient Medications:    amLODipine (NORVASC) 10 MG tablet, Take 10 mg by mouth daily., Disp: , Rfl:    aspirin EC (BAYER ASPIRIN EC LOW DOSE) 81 MG tablet, Take 81 mg by mouth once., Disp: , Rfl:    atorvastatin (LIPITOR) 40 MG tablet, Take 40 mg by mouth daily., Disp: , Rfl:    ezetimibe (ZETIA) 10 MG tablet, Take 10 mg by mouth daily., Disp: , Rfl:    levothyroxine (SYNTHROID) 25 MCG tablet, Take 1 tab by mouth daily for thyroid Oral Once a day for 90 days, Disp: , Rfl:    lisinopril (ZESTRIL) 10 MG tablet, Take 1 tablet (10 mg total) by mouth daily., Disp: 90 tablet, Rfl: 3   meloxicam (MOBIC) 15 MG tablet, Take 1 tablet by mouth daily.,  Disp: , Rfl:    methocarbamol (ROBAXIN) 500 MG tablet, Take 1 tablet by mouth every 8 (eight) hours., Disp: , Rfl:    naproxen (NAPROSYN) 375 MG tablet, Take 1 tablet by mouth 2 (two) times daily., Disp: , Rfl:    Cardiovascular and other pertinent studies:  Reviewed external labs and tests, independently interpreted  EKG 04/12/2022: Sinus rhythm 80 bpm  Old anteroseptal infarct  Echocardiogram 07/20/2022:  Left ventricle cavity is normal in size. Mild concentric hypertrophy of  the left ventricle. Normal global wall motion. Normal LV systolic function  with EF 73%. Doppler evidence of grade I (impaired) diastolic dysfunction,  normal LAP.  Left atrial cavity is moderately dilated at 42.9 ml/m^2.  Trileaflet aortic valve with moderate aortic valve leaflet calcification.  Moderate to severe aortic stenosis. Vmax 4.2 m/sec, mean PG 32 mmHg, AVA  1.3 cm by continuity equation, dimensionless index 0.37. Moderate (Grade  II) aortic regurgitation. Increased gradients and velocity could partially  be due to at least moderate aortic regurgitation.  Mild (Grade I) mitral regurgitation.  Mild tricuspid regurgitation.  No evidence of pulmonary hypertension.  Compared to previous study on 03/28/2022, no significant change noted.   CT chest 12/11/2021: 1. No pulmonary emboli or acute abnormality. 2. Cardiomegaly. 3. Enlarged central pulmonary arteries, compatible with pulmonary arterial hypertension. 4. Calcific coronary artery and aortic atherosclerosis. 5. Small hiatal hernia or mildly  prominent esophageal ampulla. 6. Mild linear atelectasis or scarring in both lower lobes and left upper lobe. Aortic Atherosclerosis   Recent labs: 08/25/2022: Glucose 105, BUN/Cr 11/0.82. EGFR 75. Na/K 140/4.0.  Chol 192, TG 86, HDL 65, LDL 111  12/11/2021: Glucose 105, BUN/Cr 14/0.79. EGFR >60. Na/K 139/3.8.  H/H 14/45. MCV 92. Platelets 339  08/2021: HbA1C 6.0% Chol 232, TG 104, HDL 73, LDL  138 TSH 2.1 normal Trop HS 14    Review of Systems  Cardiovascular:  Negative for chest pain, dyspnea on exertion, leg swelling, orthopnea, palpitations and syncope.  Musculoskeletal:  Positive for joint pain.         Vitals:   10/04/22 1128  BP: 125/66  Pulse: 71  Resp: 16  SpO2: 96%      Body mass index is 33.89 kg/m. Filed Weights   10/04/22 1128  Weight: 210 lb (95.3 kg)      Objective:   Physical Exam Vitals and nursing note reviewed.  Constitutional:      General: She is not in acute distress. Neck:     Vascular: No JVD.  Cardiovascular:     Rate and Rhythm: Normal rate and regular rhythm.     Heart sounds: Murmur heard.     Harsh midsystolic murmur is present with a grade of 3/6 at the upper right sternal border radiating to the neck.     High-pitched blowing decrescendo early diastolic murmur is present with a grade of 2/4 at the upper right sternal border radiating to the apex.  Pulmonary:     Effort: Pulmonary effort is normal.     Breath sounds: Normal breath sounds. No wheezing or rales.  Musculoskeletal:     Right lower leg: No edema.     Left lower leg: No edema.         Visit diagnoses:   ICD-10-CM   1. Nonrheumatic aortic valve stenosis  I35.0 Pro b natriuretic peptide (BNP)9[facility])    PCV ECHOCARDIOGRAM COMPLETE    Pro b natriuretic peptide (BNP)9[facility] [facility])    2. Nonrheumatic aortic valve insufficiency  I35.1     3. Essential hypertension  I10     4. Mixed hyperlipidemia  E78.2 Lipid panel    Lipid panel        Orders Placed This Encounter  Procedures   Lipid panel   Pro b natriuretic peptide (BNP)[facility])   PCV ECHOCARDIOGRAM COMPLETE      Assessment & Recommendations:   72 y/o [place] American female with hypertension, hyperlipidemia, former smoker, arthritis, referred for evaluation of aortic stenosis  Aortic stenosis/aortic  regurgitation: I personally reviewed CT chest 12/2021, and echocardiogram 07/2022. Moderate to severe aortic stenosis, moderate (Grade II) aortic regurgitation No immediate indication for aortic valve replacement. Repeat echocardiogram, and check proBNP in 3 months.  Hypertension: Well-controlled.  Mixed hyperlipidemia: Chol 192, TG 86, HDL 65, LDL 111 (08/2022). Increase Lipitor to 80 mg daily. Repeat panel in 3 months.  F/u in 02/2023.    Elder Negus, MD Pager: [phone] Office: [phone]

## 2022-10-13 NOTE — Progress Notes (Signed)
This encounter was created in error - please disregard.

## 2022-10-17 ENCOUNTER — Encounter: Payer: Self-pay | Admitting: Cardiology

## 2022-10-28 IMAGING — DX DG ELBOW COMPLETE 3+V*L*
4 series · 4 of 4 positions shown · non-contrast
Comparison: None.

CLINICAL DATA: Chronic pain

EXAM:
LEFT ELBOW - COMPLETE 4 VIEW

[elbow ap]
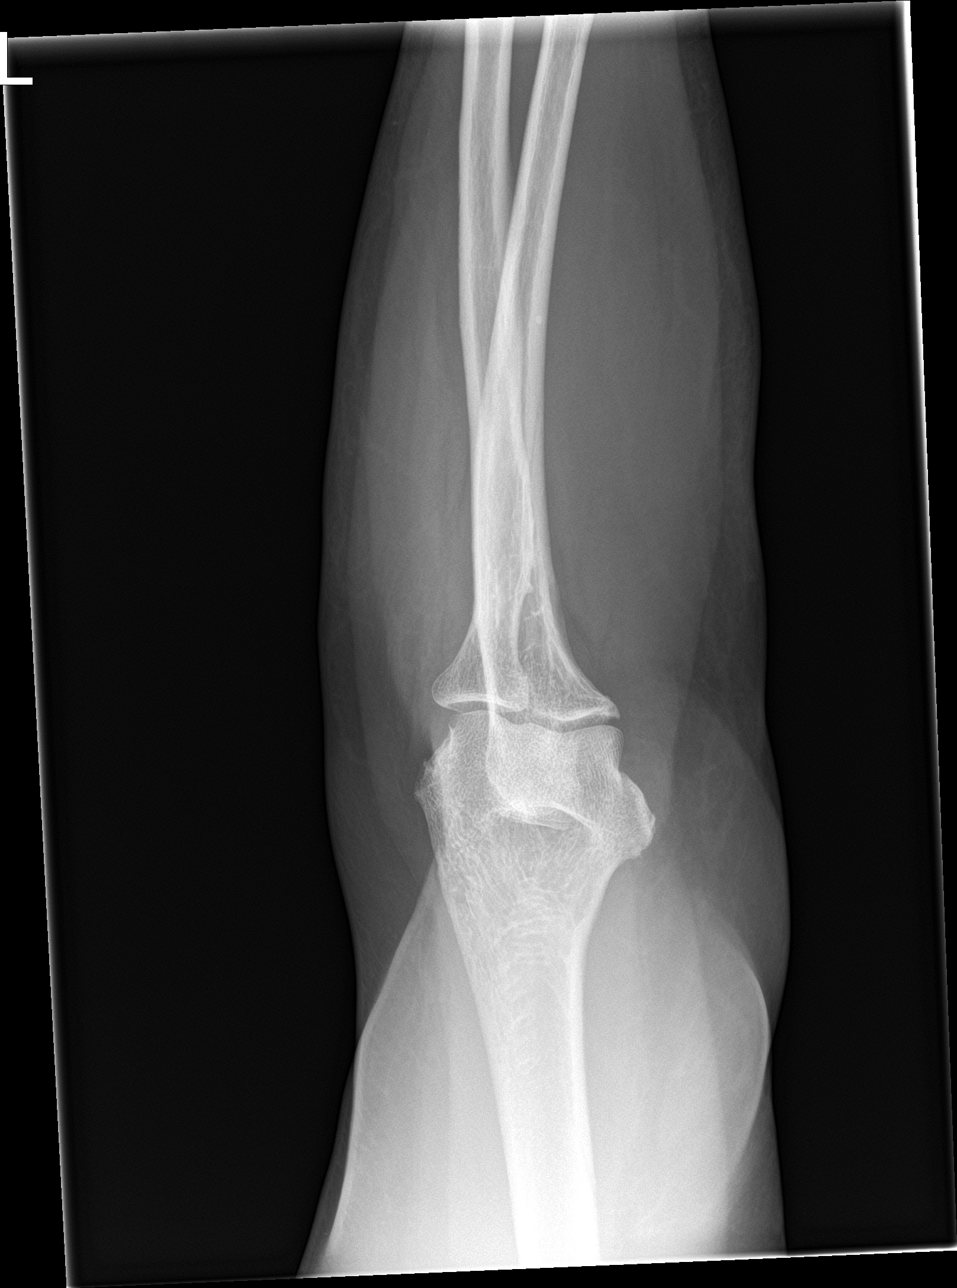

[elbow obl (1 of 2)]
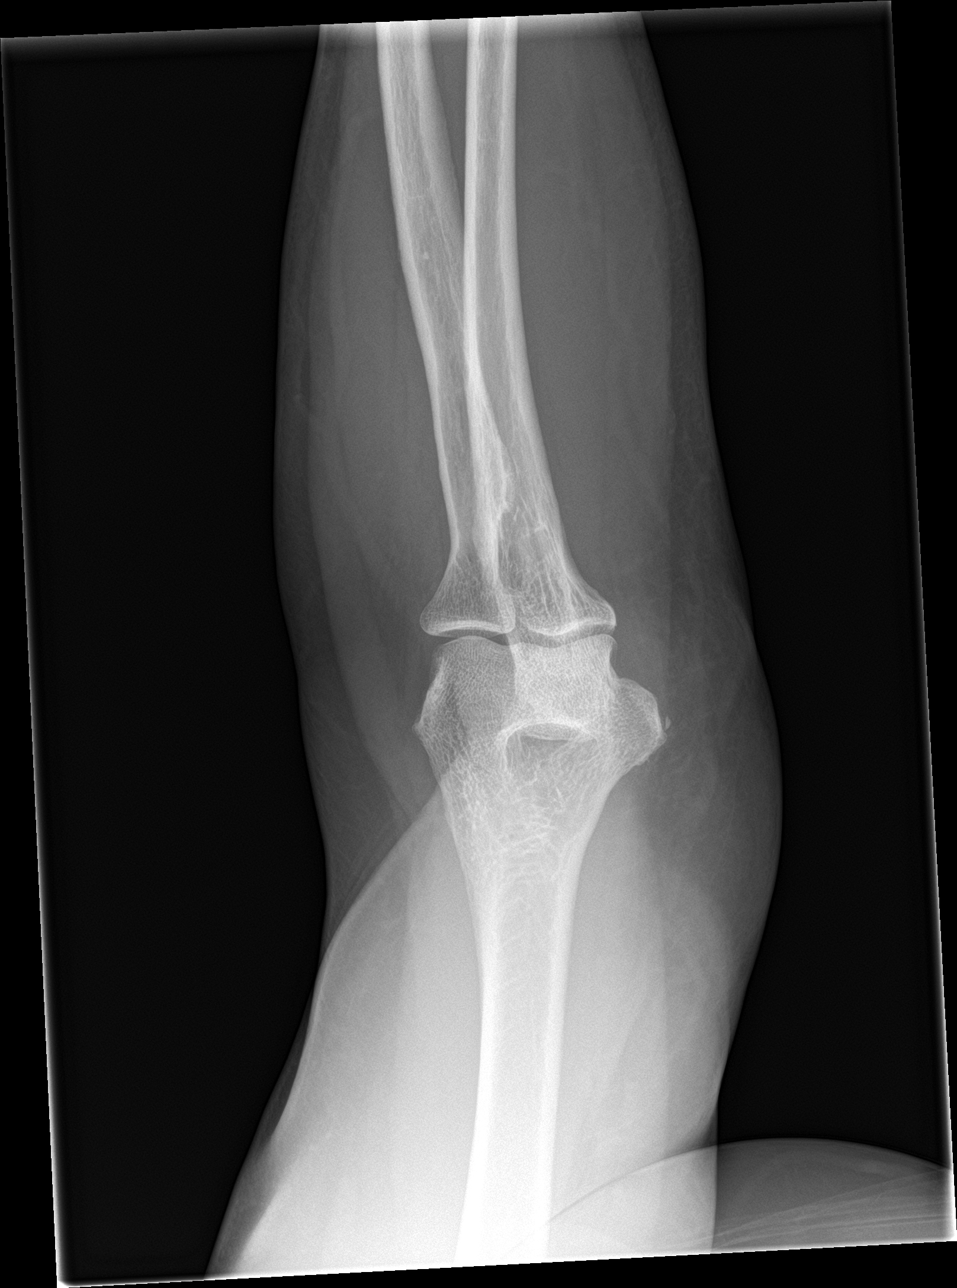

[elbow obl (2 of 2)]
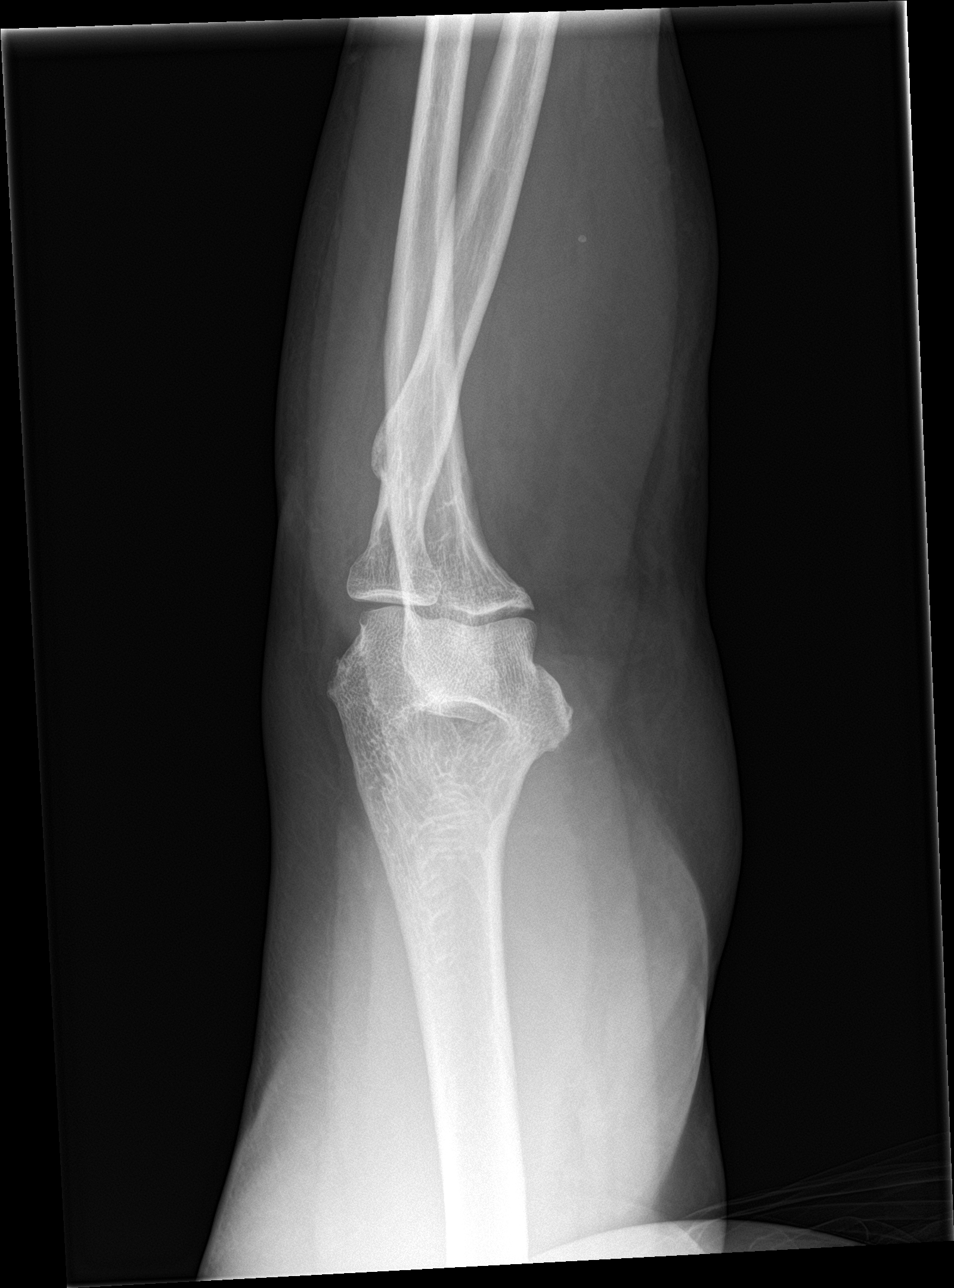

[elbow lat]
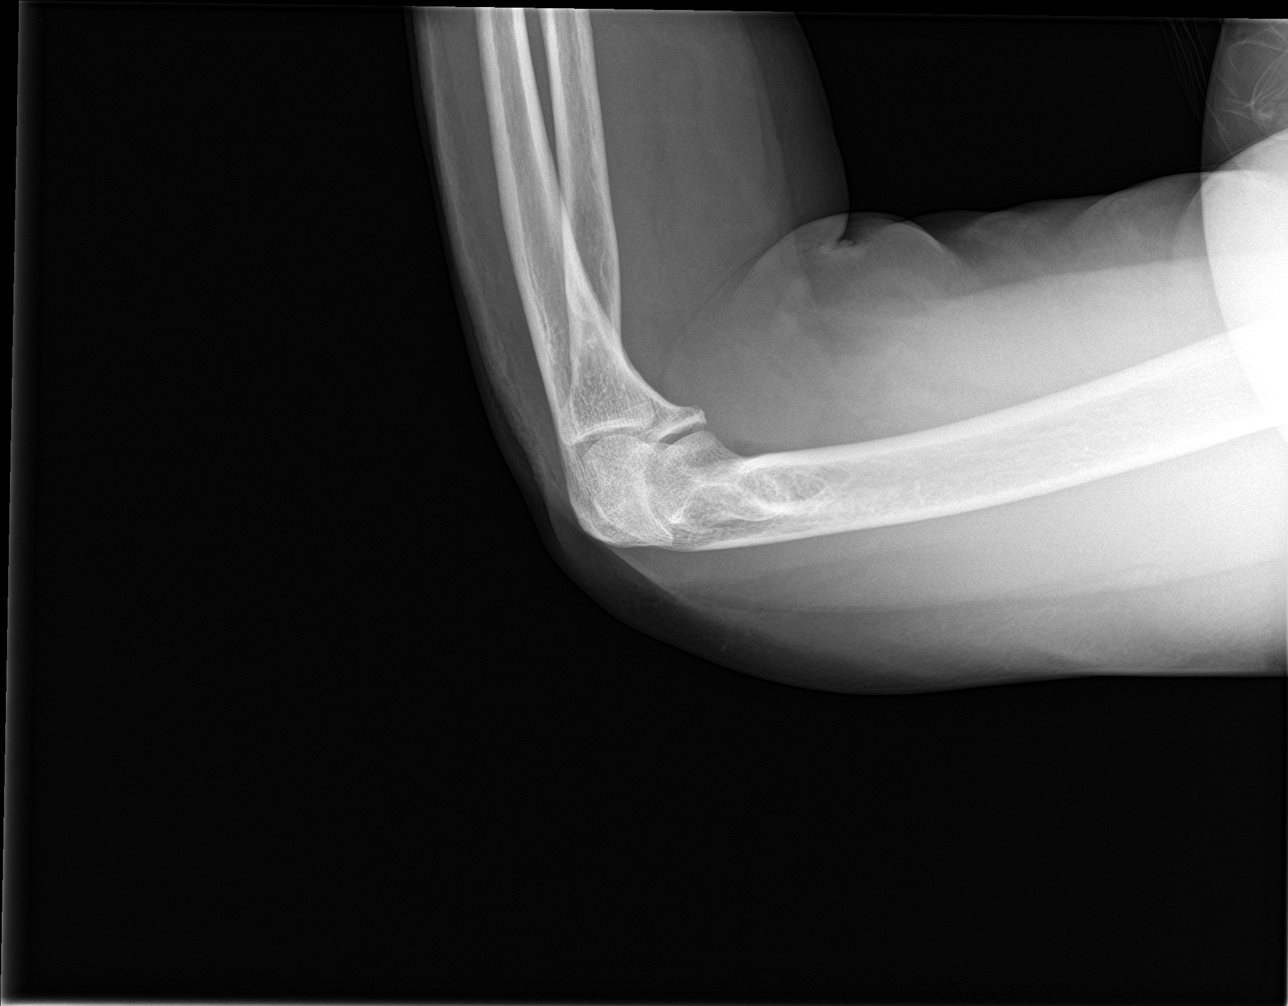

[4 of 4 positions shown; findings below may reference images not displayed]

FINDINGS: There is no evidence of fracture, dislocation, or joint effusion.
There is no evidence of significant arthropathy or other focal bone
abnormality. Soft tissues are unremarkable.
IMPRESSION: Negative.

## 2022-12-18 ENCOUNTER — Other Ambulatory Visit (HOSPITAL_COMMUNITY): Payer: 59

## 2022-12-25 ENCOUNTER — Other Ambulatory Visit: Payer: 59

## 2023-01-18 ENCOUNTER — Ambulatory Visit (HOSPITAL_COMMUNITY): Payer: 59 | Attending: Cardiology

## 2023-01-18 DIAGNOSIS — I350 Nonrheumatic aortic (valve) stenosis: Secondary | ICD-10-CM | POA: Diagnosis present

## 2023-01-18 LAB — ECHOCARDIOGRAM COMPLETE
AR max vel: 1.05 cm2
AV Area VTI: 1.14 cm2
AV Area mean vel: 1.03 cm2
AV Mean grad: 41.6 mm[Hg]
AV Peak grad: 70 mm[Hg]
Ao pk vel: 4.18 m/s
Area-P 1/2: 2.48 cm2
P 1/2 time: 411 ms
S' Lateral: 2.7 cm

## 2023-01-18 NOTE — Progress Notes (Signed)
Noted echocardiogram results and reviewed with previous echocardiogram.  There does appear to be worsening of the aortic regurgitation, along with relatively stable moderate to severe aortic stenosis.  I am away for next 2 weeks, therefore we will be unable to see her until January at the earliest.  Agree with referral to structural valve clinic.

## 2023-01-19 NOTE — Progress Notes (Signed)
Ok. I wish her the same.  Thanks MJP

## 2023-02-09 ENCOUNTER — Ambulatory Visit: Payer: Self-pay | Admitting: Cardiology

## 2023-02-13 ENCOUNTER — Encounter: Payer: Self-pay | Admitting: Cardiology

## 2023-02-13 ENCOUNTER — Ambulatory Visit: Payer: 59 | Attending: Cardiology | Admitting: Cardiology

## 2023-02-13 VITALS — BP 134/62 | HR 77 | Resp 16 | Ht 66.0 in | Wt 203.0 lb

## 2023-02-13 DIAGNOSIS — I350 Nonrheumatic aortic (valve) stenosis: Secondary | ICD-10-CM | POA: Diagnosis not present

## 2023-02-13 DIAGNOSIS — E782 Mixed hyperlipidemia: Secondary | ICD-10-CM | POA: Insufficient documentation

## 2023-02-13 DIAGNOSIS — I351 Nonrheumatic aortic (valve) insufficiency: Secondary | ICD-10-CM | POA: Diagnosis not present

## 2023-02-13 DIAGNOSIS — I10 Essential (primary) hypertension: Secondary | ICD-10-CM | POA: Diagnosis not present

## 2023-02-13 LAB — LIPID PANEL
Chol/HDL Ratio: 3.5 {ratio} (ref 0.0–4.4)
Cholesterol, Total: 258 mg/dL — ABNORMAL HIGH (ref 100–199)
HDL: 74 mg/dL (ref >39)
LDL Chol Calc (NIH): 162 mg/dL — ABNORMAL HIGH (ref 0–99)
Triglycerides: 123 mg/dL (ref 0–149)
VLDL Cholesterol Cal: 22 mg/dL (ref 5–40)

## 2023-02-13 LAB — BASIC METABOLIC PANEL
BUN/Creatinine Ratio: 13 (ref 12–28)
BUN: 10 mg/dL (ref 8–27)
CO2: 24 mmol/L (ref 20–29)
Calcium: 10.1 mg/dL (ref 8.7–10.3)
Chloride: 101 mmol/L (ref 96–106)
Creatinine, Ser: 0.78 mg/dL (ref 0.57–1.00)
Glucose: 104 mg/dL — ABNORMAL HIGH (ref 70–99)
Potassium: 3.8 mmol/L (ref 3.5–5.2)
Sodium: 141 mmol/L (ref 134–144)
eGFR: 80 mL/min/{1.73_m2} (ref >59)

## 2023-02-13 LAB — PRO B NATRIURETIC PEPTIDE: NT-Pro BNP: 276 pg/mL (ref 0–301)

## 2023-02-13 NOTE — Progress Notes (Signed)
 Cardiology Office Note:  .   Date:  02/13/2023  ID:  Caroline Mcconnell, DOB Mar 05, 1950, MRN [id number] PCP: Stephane Leita DEL, MD  [facility] Providers Cardiologist:  Newman Lawrence, MD PCP: Stephane Leita DEL, MD  Chief Complaint  Patient presents with   Nonrheumatic aortic valve stenosis   Follow-up      History of Present Illness: .    Caroline Mcconnell is a 73 y.o. female with hypertension, hyperlipidemia, former smoker, arthritis, aortic stenosis   I personally reviewed and independently completed patient's echocardiogram performed on 01/18/2023.  Patient stays asymptomatic at this time.  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  With this level of activity, she denies any chest pain, shortness of breath, presyncope, syncope, orthopnea, PND, leg edema.  Her lisinopril  was discontinued, by PCP.  Blood pressure is elevated today on first check, subsequently improved.  Also reviewed patient's labs from July and August 2024.  Vitals:   02/13/23 0806 02/13/23 0832  BP: (!) 150/78 134/62  Pulse: 77   Resp: 16   SpO2: 93%      ROS:  Review of Systems  Cardiovascular:  Negative for chest pain, dyspnea on exertion, leg swelling, palpitations and syncope.     Studies Reviewed: SABRA       Independently interpreted 09/2022: Chol 195, TG 90, HDL 61, LDL 116 HbA1C 5.7% Hb 14.7 Cr 0.8 BNP 22  Independently interpreted Echocardiogram 01/18/2023: EF 70 to 75%, grade 1 diastolic function. Normal RV systolic function. Moderate left atrial dilation. Calcified trileaflet aortic valve. Moderate to severe aortic stenosis, severe aortic regurgitation.    Physical Exam:   Physical Exam Vitals and nursing note reviewed.  Constitutional:      General: She is not in acute distress. Neck:     Vascular: No JVD.  Cardiovascular:     Rate and Rhythm: Normal rate  and regular rhythm.     Heart sounds: Murmur heard.     Harsh midsystolic murmur is present with a grade of 3/6 at the upper right sternal border radiating to the neck.     High-pitched blowing decrescendo early diastolic murmur is present with a grade of 2/4 at the upper right sternal border radiating to the apex.  Pulmonary:     Effort: Pulmonary effort is normal.     Breath sounds: Normal breath sounds. No wheezing or rales.  Musculoskeletal:     Right lower leg: No edema.     Left lower leg: No edema.      VISIT DIAGNOSES:   ICD-10-CM   1. Nonrheumatic aortic valve stenosis  I35.0 Pro b natriuretic peptide (BNP)    Basic metabolic panel    ECHOCARDIOGRAM COMPLETE    Lipid Profile    Lipid panel    Lipid Profile    Basic metabolic panel    Pro b natriuretic peptide (BNP)    2. Nonrheumatic aortic valve insufficiency  I35.1 Pro b natriuretic peptide (BNP)    Basic metabolic panel    ECHOCARDIOGRAM COMPLETE    Lipid Profile    Lipid panel    Lipid Profile    Basic metabolic panel    Pro b natriuretic peptide (BNP)    3. Essential hypertension  I10 Pro b natriuretic peptide (BNP)    Basic metabolic panel    Lipid Profile    Lipid  Profile    Basic metabolic panel    Pro b natriuretic peptide (BNP)    4. Mixed hyperlipidemia  E78.2 Pro b natriuretic peptide (BNP)    Basic metabolic panel    Lipid Profile    Lipid Profile    Basic metabolic panel    Pro b natriuretic peptide (BNP)       ASSESSMENT AND PLAN: .    Caroline Mcconnell is a 73 y.o. female with  hypertension, hyperlipidemia, former smoker, arthritis, referred for evaluation of aortic stenosis   Aortic stenosis/aortic regurgitation: Moderate to severe aortic stenosis and aortic regurgitation. However, patient remains completely asymptomatic. BNP in July was normal. I discussed with the patient the following 2 options: Conservative approach with every 72-month clinical follow-up, and every 5-month  echocardiogram, in absence of symptoms versus proceeding with left and right heart catheterization and evaluation by the structural valve team. In absence of symptoms, patient prefers a conservative approach. I will recheck proBNP, and BMP today and check echocardiogram in 6 months.   Hypertension: Fairly well-controlled on amlodipine 10 mg daily.   Mixed hyperlipidemia: Chol 192, TG 86, HDL 65, LDL 111 (08/2022). Continue Lipitor to 80 mg daily. Check lipid panel today.   F/u in 02/2023.  F/u in 3 months  Signed, Newman JINNY Lawrence, MD

## 2023-02-13 NOTE — Patient Instructions (Signed)
 Medication Instructions:   Your physician recommends that you continue on your current medications as directed. Please refer to the Current Medication list given to you today.  *If you need a refill on your cardiac medications before your next appointment, please call your pharmacy*   Lab Work:  TODAY--DOWNSTAIRS AT [facility] 1ST FLOOR--LIPIDS, BMET, AND PRO-BNP  If you have labs (blood work) drawn today and your tests are completely normal, you will receive your results only by: MyChart Message (if you have MyChart) OR A paper copy in the mail If you have any lab test that is abnormal or we need to change your treatment, we will call you to review the results.   Testing/Procedures:  Your physician has requested that you have an echocardiogram. Echocardiography is a painless test that uses sound waves to create images of your heart. It provides your doctor with information about the size and shape of your heart and how well your heart's chambers and valves are working. This procedure takes approximately one hour. There are no restrictions for this procedure. SCHEDULE ECHO TO BE DONE IN 6 MONTHS PER DR. PATWARDHAN   Please do NOT wear cologne, perfume, aftershave, or lotions (deodorant is allowed). Please arrive 15 minutes prior to your appointment time.  Please note: We ask at that you not bring children with you during ultrasound (echo/ vascular) testing. Due to room size and safety concerns, children are not allowed in the ultrasound rooms during exams. Our front office staff cannot provide observation of children in our lobby area while testing is being conducted. An adult accompanying a patient to their appointment will only be allowed in the ultrasound room at the discretion of the ultrasound technician under special circumstances. We apologize for any inconvenience.    Follow-Up:  3 MONTHS WITH DR. PATWARDHAN

## 2023-02-15 ENCOUNTER — Other Ambulatory Visit: Payer: Self-pay

## 2023-02-15 NOTE — Progress Notes (Signed)
 Cholesterol is elevated. Rest of the labs look good. Is she taking Lipitor 80 mg regularly? If not, them please emphasize compliant. If yes, then please add Zetia 10 mg daily.  Thanks MJP

## 2023-02-16 ENCOUNTER — Telehealth: Payer: Self-pay | Admitting: *Deleted

## 2023-02-16 DIAGNOSIS — Z79899 Other long term (current) drug therapy: Secondary | ICD-10-CM

## 2023-02-16 DIAGNOSIS — E782 Mixed hyperlipidemia: Secondary | ICD-10-CM

## 2023-02-16 NOTE — Telephone Encounter (Signed)
 Pt aware that per Dr. Elmira, we will refer her to lipid clinic for consideration of PCSK9-Inhibitors.   Pt aware that I will place the referral in the system and send a message to our scheduler, to call her back to arrange this appt.   Advised her to continue her current regimen for now, and see Pharmacist in lipid clinic for further management.  Pt verbalized understanding and agrees with this plan.

## 2023-02-16 NOTE — Telephone Encounter (Signed)
-----   Message from [facility] sent at 02/16/2023  1:34 PM EST ----- Recommend referral to lipid clinic for consideration for PCSK9i.  Thanks MJP

## 2023-02-16 NOTE — Progress Notes (Signed)
 Recommend referral to lipid clinic for consideration for PCSK9i.  Thanks MJP

## 2023-03-28 ENCOUNTER — Ambulatory Visit: Payer: 59

## 2023-05-11 ENCOUNTER — Telehealth: Payer: Self-pay | Admitting: Pharmacist

## 2023-05-11 ENCOUNTER — Ambulatory Visit: Payer: 59 | Attending: Internal Medicine | Admitting: Pharmacist

## 2023-05-11 ENCOUNTER — Other Ambulatory Visit (HOSPITAL_COMMUNITY): Payer: Self-pay

## 2023-05-11 ENCOUNTER — Encounter: Payer: Self-pay | Admitting: Pharmacist

## 2023-05-11 ENCOUNTER — Telehealth: Payer: Self-pay | Admitting: Pharmacy Technician

## 2023-05-11 VITALS — Ht 66.0 in | Wt 203.0 lb

## 2023-05-11 DIAGNOSIS — E782 Mixed hyperlipidemia: Secondary | ICD-10-CM | POA: Diagnosis not present

## 2023-05-11 MED ORDER — REPATHA SURECLICK 140 MG/ML ~~LOC~~ SOAJ: 140.0000 mg | SUBCUTANEOUS | 3 refills | Status: DC

## 2023-05-11 NOTE — Progress Notes (Signed)
 Patient ID: Caroline Mcconnell                 DOB: 1950-02-23                    MRN: [id number]      HPI: Caroline Mcconnell is a 73 y.o. female patient referred to lipid clinic by Dr.Patwardhan. PMH is significant for hypertension, hyperlipidemia, former smoker, arthritis, aortic stenosis   Patient presented today for lipid clinic. Reports before last lipid lab she was on 40 mg Lipitor. LDL was elevated so they up the Lipitor dose to 80 mg daily and her PCP added Zetia 10 mg daily. She tolerates both well. Follows healthy diet - was 360 lbs when moved from [place] to [place] in 2020, now weigh 203 lbs  Reviewed options for lowering LDL cholesterol, including ezetimibe, PCSK-9 inhibitors, bempedoic acid and inclisiran.  Discussed mechanisms of action, dosing, side effects and potential decreases in LDL cholesterol.  Also reviewed cost information and potential options for patient assistance.  Current Medications: Lipitor 80 mg daily and Zetia 10 mg daily  Intolerances:  Risk Factors: hypertension, hyperlipidemia, former smoker, , aortic stenosis  LDL goal: <70 mg/dl  Last lab 40/9811: LDLc 162, TG 123, TC 258 while on Lipitor 40 mg daily   Diet: stopped drinking soda, fried food and red meat  Eats lots of vegetables, grilled chicken and drinks lot of water   Exercise: walking- 60 min most days of the week   Family History:  Was raised in foster home as kids   Social History:  Alcohol: none  Smoking: occasional- will be talking to PCP for quit option    Labs:  Lipid Panel     Component Value Date/Time   CHOL 258 (H) 02/13/2023 0909   TRIG 123 02/13/2023 0909   HDL 74 02/13/2023 0909   CHOLHDL 3.5 02/13/2023 0909   LDLCALC 162 (H) 02/13/2023 0909   LABVLDL 22 02/13/2023 0909    Past Medical History:  Diagnosis Date   Arthritis    Hyperlipidemia    Hypertension    Sciatica    Scoliosis    Thyroid disease     Current Outpatient Medications on File Prior to Visit  Medication  Sig Dispense Refill   amLODipine (NORVASC) 10 MG tablet Take 10 mg by mouth daily.     aspirin EC (BAYER ASPIRIN EC LOW DOSE) 81 MG tablet Take 81 mg by mouth once.     atorvastatin (LIPITOR) 80 MG tablet Take 1 tablet (80 mg total) by mouth daily. 90 tablet 3   ezetimibe (ZETIA) 10 MG tablet Take 10 mg by mouth daily.     levothyroxine (SYNTHROID) 25 MCG tablet Take 1 tab by mouth daily for thyroid Oral Once a day for 90 days     meloxicam (MOBIC) 15 MG tablet Take 1 tablet by mouth daily.     No current facility-administered medications on file prior to visit.    Allergies  Allergen Reactions   Penicillins Anaphylaxis   Other Other (See Comments)    Assessment/Plan:  1. Hyperlipidemia -  Problem  Mixed Hyperlipidemia   Mixed hyperlipidemia Assessment:  LDL goal: < 70 mg/dl last LDLc 914 mg/dl (78/29) while on Lipitor 40 mg daily  In Jan Lipitor dose was increased to 80 mg daily and Zetia 10 mg was added too  Given last LDLc level and goal LDLc above mention changes are insufficient   Discussed next potential options (PCSK-9 inhibitors, bempedoic  acid and inclisiran); cost, dosing efficacy, side effects  Follows healthy diet - was 360 lbs when moved from [place] to [place] in 2020, now weigh 203 lbs  Walks a lot most days of the week    Plan: Continue taking current medications (Lipitor 80 mg daily and Zetia 10 mg daily) Will apply for PA for PCSK9i; will inform patient upon approval Lipid lab due in 2-3 months after starting PCSK9i    Thank you,  Carmela Hurt, Pharm.D [facility] Division of [facility] [address] [place], [place] [address]  Phone: [phone]; Fax: [phone]

## 2023-05-11 NOTE — Telephone Encounter (Signed)
 Pharmacy Patient Advocate Encounter  Received notification from [facility] that Prior Authorization for Repatha has been APPROVED from 05/11/23 to 11/10/23. Ran test claim, Copay is $0.00- one month. This test claim was processed through [facility]- copay amounts may vary at other pharmacies due to pharmacy/plan contracts, or as the patient moves through the different stages of their insurance plan.   PA #/Case ID/Reference #: [id number]

## 2023-05-11 NOTE — Telephone Encounter (Signed)
 Pharmacy Patient Advocate Encounter   Received notification from Pt Calls Messages that prior authorization for Repatha is required/requested.   Insurance verification completed.   The patient is insured through [place] .   Per test claim: PA required; PA submitted to above mentioned insurance via CoverMyMeds Key/confirmation #/[facility] Status is pending

## 2023-05-11 NOTE — Patient Instructions (Signed)
 Your Results:             Your most recent labs Goal  Total Cholesterol 258 < 200  Triglycerides 123 < 150  HDL (happy/good cholesterol) 74 > 40  LDL (lousy/bad cholesterol 162 < 70   Medication changes: We will start the process to get PCSK9i( Repatha or Praluent)  covered by your insurance.  Once the prior authorization is complete, we will call you to let you know and confirm pharmacy information.    Praluent is a cholesterol medication that improved your body's ability to get rid of "bad cholesterol" known as LDL. It can lower your LDL up to 60%. It is an injection that is given under the skin every 2 weeks. The most common side effects of Praluent include runny nose, symptoms of the common cold, rarely flu or flu-like symptoms, back/muscle pain in about 3-4% of the patients, and redness, pain, or bruising at the injection site.    Repatha is a cholesterol medication that improved your body's ability to get rid of "bad cholesterol" known as LDL. It can lower your LDL up to 60%! It is an injection that is given under the skin every 2 weeks. The medication often requires a prior authorization from your insurance company.  The most common side effects of Repatha include runny nose, symptoms of the common cold, rarely flu or flu-like symptoms, back/muscle pain in about 3-4% of the patients, and redness, pain, or bruising at the injection site.   Lab orders: We want to repeat labs after 2-3 months.  We will send you a lab order to remind you once we get closer to that time.

## 2023-05-11 NOTE — Assessment & Plan Note (Signed)
 Assessment:  LDL goal: < 70 mg/dl last LDLc 161 mg/dl (09/60) while on Lipitor 40 mg daily  In Jan Lipitor dose was increased to 80 mg daily and Zetia 10 mg was added too  Given last LDLc level and goal LDLc above mention changes are insufficient   Discussed next potential options (PCSK-9 inhibitors, bempedoic acid and inclisiran); cost, dosing efficacy, side effects  Follows healthy diet - was 360 lbs when moved from [place] to [place] in 2020, now weigh 203 lbs  Walks a lot most days of the week    Plan: Continue taking current medications (Lipitor 80 mg daily and Zetia 10 mg daily) Will apply for PA for PCSK9i; will inform patient upon approval Lipid lab due in 2-3 months after starting [facility]

## 2023-05-14 ENCOUNTER — Ambulatory Visit: Payer: 59 | Attending: Cardiology | Admitting: Cardiology

## 2023-05-14 ENCOUNTER — Encounter: Payer: Self-pay | Admitting: Cardiology

## 2023-05-14 VITALS — BP 136/60 | HR 78 | Resp 16 | Ht 66.0 in | Wt 205.4 lb

## 2023-05-14 DIAGNOSIS — R600 Localized edema: Secondary | ICD-10-CM

## 2023-05-14 DIAGNOSIS — I350 Nonrheumatic aortic (valve) stenosis: Secondary | ICD-10-CM

## 2023-05-14 DIAGNOSIS — I10 Essential (primary) hypertension: Secondary | ICD-10-CM

## 2023-05-14 DIAGNOSIS — I351 Nonrheumatic aortic (valve) insufficiency: Secondary | ICD-10-CM | POA: Diagnosis not present

## 2023-05-14 DIAGNOSIS — E782 Mixed hyperlipidemia: Secondary | ICD-10-CM

## 2023-05-14 LAB — PRO B NATRIURETIC PEPTIDE: NT-Pro BNP: 288 pg/mL (ref 0–301)

## 2023-05-14 MED ORDER — FUROSEMIDE 20 MG PO TABS: 20.0000 mg | ORAL_TABLET | ORAL | 3 refills | Status: DC | PRN

## 2023-05-14 NOTE — Progress Notes (Signed)
 Cardiology Office Note:  .   Date:  05/14/2023  ID:  Caroline Mcconnell, DOB 1950-11-14, MRN [id number] PCP: [name], MD  [facility] Providers Cardiologist:  Truett Mainland, MD PCP: [name], MD  Chief Complaint  Patient presents with   Aortic Stenosis   Hypertension   Hyperlipidemia   Follow-up      History of Present Illness: .    Caroline Mcconnell is a 73 y.o. female with hypertension, hyperlipidemia, former smoker, arthritis, aortic stenosis   Patient is doing well denies any complaints of chest pain, shortness of breath, presyncope, syncope.  Nonspecific question, she did endorse that she had some leg swelling today.  She is starting Repatha injections today.   Vitals:   05/14/23 0830  BP: 136/60  Pulse: 78  Resp: 16  SpO2: 95%      ROS:  Review of Systems  Cardiovascular:  Negative for chest pain, dyspnea on exertion, leg swelling, palpitations and syncope.     Studies Reviewed: Marland Kitchen        Independently interpreted 02/2023: Chol 258, TG 123, HDL 74, LDL 162 Cr 0.78  Echocardiogram 01/18/2023: EF 70 to 75%, grade 1 diastolic function. Normal RV systolic function. Moderate left atrial dilation. Calcified trileaflet aortic valve. Moderate to severe aortic stenosis, severe aortic regurgitation.    Physical Exam:   Physical Exam Vitals and nursing note reviewed.  Constitutional:      General: She is not in acute distress. Neck:     Vascular: No JVD.  Cardiovascular:     Rate and Rhythm: Normal rate and regular rhythm.     Heart sounds: Murmur heard.     Harsh midsystolic murmur is present with a grade of 3/6 at the upper right sternal border radiating to the neck.     High-pitched blowing decrescendo early diastolic murmur is present with a grade of 2/4 at the upper right sternal border radiating to the apex.  Pulmonary:     Effort: Pulmonary effort is normal.     Breath sounds: Normal breath sounds. No wheezing or rales.   Musculoskeletal:     Right lower leg: No edema.     Left lower leg: No edema.      VISIT DIAGNOSES:   ICD-10-CM   1. Nonrheumatic aortic valve stenosis  I35.0     2. Nonrheumatic aortic valve insufficiency  I35.1     3. Essential hypertension  I10     4. Mixed hyperlipidemia  E78.2         ASSESSMENT AND PLAN: .    Caroline Mcconnell is a 73 y.o. female with  hypertension, hyperlipidemia, former smoker, arthritis, referred for evaluation of aortic stenosis   Aortic stenosis/aortic regurgitation: Moderate to severe aortic stenosis and aortic regurgitation. However, patient remains completely asymptomatic. ProBNP in 02/2023 was normal. Previously, I discussed with the patient the following 2 options: Conservative approach with every 70-month clinical follow-up, and every 66-month echocardiogram, in absence of symptoms versus proceeding with left and right heart catheterization and evaluation by the structural valve team. In absence of symptoms, patient prefers a conservative approach.  Leg edema noted on exam today.  This could be due to amlodipine 10 mg daily, varicose veins, or fluid retention related to aortic stenosis/regurgitation. I do not think we will be able to reduce amlodipine dose without losing blood pressure control. Prescribed Lasix per milligram daily for as needed use. Will check proBNP today. Recommend low-salt diet, leg elevation at night. Check echocardiogram  in 08/2023, unless proBNP is elevated.  In that case, we will check in sooner.  Hypertension: Fairly well-controlled on amlodipine 10 mg daily.   Mixed hyperlipidemia: Chol 192, TG 86, HDL 65, LDL 111 (08/2022). Continue Lipitor to 80 mg daily, Zetia 10 mg daily. Starting Repatha injections today.  F/u in 3 months  Signed, Elder Negus, MD

## 2023-05-14 NOTE — Patient Instructions (Signed)
 Medication Instructions:  START Lasix 20 mg take one tablet by mouth as needed for swelling.   *If you need a refill on your cardiac medications before your next appointment, please call your pharmacy*  Lab Work: ProBNP  If you have labs (blood work) drawn today and your tests are completely normal, you will receive your results only by: MyChart Message (if you have MyChart) OR A paper copy in the mail If you have any lab test that is abnormal or we need to change your treatment, we will call you to review the results.  Testing/Procedures: ECHO IN JULY 2025  Follow-Up: At [facility], you and your health needs are our priority.  As part of our continuing mission to provide you with exceptional heart care, our providers are all part of one team.  This team includes your primary Cardiologist (physician) and Advanced Practice Providers or APPs (Physician Assistants and Nurse Practitioners) who all work together to provide you with the care you need, when you need it.  Your next appointment:   08/2023   Provider:   Elder Negus, MD     We recommend signing up for the patient portal called "MyChart".  Sign up information is provided on this After Visit Summary.  MyChart is used to connect with patients for Virtual Visits (Telemedicine).  Patients are able to view lab/test results, encounter notes, upcoming appointments, etc.  Non-urgent messages can be sent to your provider as well.   To learn more about what you can do with MyChart, go to [email or web address].   Other Instructions      1st Floor: - Lobby - Registration  - Pharmacy  - Lab - Cafe  2nd Floor: - PV Lab - Diagnostic Testing (echo, CT, nuclear med)  3rd Floor: - Vacant  4th Floor: - TCTS (cardiothoracic surgery) - AFib Clinic - Structural Heart Clinic - Vascular Surgery  - Vascular Ultrasound  5th Floor: - HeartCare Cardiology (general and EP) - Clinical Pharmacy for coumadin,  hypertension, lipid, weight-loss medications, and med management appointments    Valet parking services will be available as well.

## 2023-05-15 NOTE — Progress Notes (Signed)
 Marker of congestion is normal. I suspect leg swelling could be due to venous insufficiency. Recommend leg elevation and compression stockings.  Thanks MJP

## 2023-05-17 ENCOUNTER — Telehealth: Payer: Self-pay | Admitting: Cardiology

## 2023-05-17 NOTE — Telephone Encounter (Signed)
 Pt returning call for lab results

## 2023-05-18 NOTE — Telephone Encounter (Signed)
Review result note

## 2023-06-01 NOTE — Telephone Encounter (Signed)
 PA for Repatha  approved see office visit encounter for more info.

## 2023-07-16 ENCOUNTER — Telehealth: Payer: Self-pay | Admitting: Cardiology

## 2023-07-16 NOTE — Telephone Encounter (Signed)
 Pt c/o medication issue:  1. Name of Medication: Evolocumab  (REPATHA  SURECLICK) 140 MG/ML SOAJ   2. How are you currently taking this medication (dosage and times per day)? Inject 140 mg into the skin every 14 (fourteen) days.   3. Are you having a reaction (difficulty breathing--STAT)? No  4. What is your medication issue? Patient is calling because the patient's granddaughter picked up the medication and moved to [place] . Patient's granddaughter accidentally took the medication with her to [place] . Patient has been unable to start the medication and would like a refill sent to the [facility] at [facility]. Patient was supposed to have labs drawn this week, but will now wait until she starts the medication. Please advise.

## 2023-07-17 NOTE — Telephone Encounter (Signed)
 Refill is most likely going to be too soon. Called pt to see if she took the entire 90 days.

## 2023-07-25 MED ORDER — REPATHA SURECLICK 140 MG/ML ~~LOC~~ SOAJ: 140.0000 mg | SUBCUTANEOUS | 3 refills | Status: AC

## 2023-07-25 NOTE — Telephone Encounter (Signed)
 It looks like [facility] dispensed a 90 day supply on 4/4 so it will most likely be too soon to fill. Tired to call pt but no answer x 2

## 2023-07-26 NOTE — Telephone Encounter (Signed)
 I talked to patient, I gave her # for product replacement to see if they will replace a few of them.

## 2023-07-27 NOTE — Telephone Encounter (Signed)
 Called and gave verbal to [facility] for product replacement for Repatha  4 pens

## 2023-08-13 ENCOUNTER — Ambulatory Visit (HOSPITAL_COMMUNITY)
Admission: RE | Admit: 2023-08-13 | Discharge: 2023-08-13 | Disposition: A | Discharge status: Home / Self Care | Payer: 59 | Source: Ambulatory Visit | Attending: Psychiatry | Admitting: Psychiatry

## 2023-08-13 DIAGNOSIS — I350 Nonrheumatic aortic (valve) stenosis: Secondary | ICD-10-CM | POA: Insufficient documentation

## 2023-08-13 DIAGNOSIS — I351 Nonrheumatic aortic (valve) insufficiency: Secondary | ICD-10-CM | POA: Insufficient documentation

## 2023-08-13 LAB — ECHOCARDIOGRAM COMPLETE
AR max vel: 0.86 cm2
AV Area VTI: 0.66 cm2
AV Area mean vel: 0.81 cm2
AV Mean grad: 40 mmHg
AV Peak grad: 65.6 mmHg
Ao pk vel: 4.05 m/s
Area-P 1/2: 3.12 cm2
MV M vel: 6.11 m/s
MV Peak grad: 149.3 mmHg
P 1/2 time: 346 ms
S' Lateral: 3.7 cm

## 2023-08-14 NOTE — Progress Notes (Deleted)
 Cardiology Office Note:  .   Date:  08/14/2023  ID:  Caroline Mcconnell, DOB 1950-06-06, MRN [id number] PCP: Stephane Leita DEL, MD  [facility] Providers Cardiologist:  Newman Lawrence, MD PCP: Stephane Leita DEL, MD  No chief complaint on file.     History of Present Illness: .    Caroline Mcconnell is a 73 y.o. female with hypertension, hyperlipidemia, former smoker, arthritis, aortic stenosis   Patient is doing well denies any complaints of chest pain, shortness of breath, presyncope, syncope.  Nonspecific question, she did endorse that she had some leg swelling today.  She is starting Repatha  injections today.   There were no vitals filed for this visit.     ROS:  Review of Systems  Cardiovascular:  Negative for chest pain, dyspnea on exertion, leg swelling, palpitations and syncope.     Studies Reviewed: SABRA        Independently interpreted 02/2023: Chol 258, TG 123, HDL 74, LDL 162 Cr 0.78  Echocardiogram 08/2023:  1. Left ventricular ejection fraction, by estimation, is 65 to 70%. The left ventricle has normal function. The left ventricle has no regional wall motion abnormalities. There is moderate concentric left ventricular hypertrophy. Left ventricular  diastolic parameters are consistent with Grade I diastolic dysfunction (impaired relaxation).  2. Right ventricular systolic function is normal. The right ventricular size is normal.  3. The mitral valve is normal in structure. Mild mitral valve regurgitation. No evidence of mitral stenosis.  4. The aortic valve is tricuspid. There is moderate calcification of the aortic valve. There is moderate thickening of the aortic valve. Aortic valve regurgitation is moderate to severe. Severe aortic valve stenosis. Aortic valve area, by VTI measures  0.66 cm. Aortic valve mean gradient measures 40.0 mmHg. Aortic valve Vmax measures 4.05 m/s.  5. The inferior vena cava is normal in size with greater than 50% respiratory variability,  suggesting right atrial pressure of 3 mmHg.    Physical Exam:   Physical Exam Vitals and nursing note reviewed.  Constitutional:      General: She is not in acute distress. Neck:     Vascular: No JVD.  Cardiovascular:     Rate and Rhythm: Normal rate and regular rhythm.     Heart sounds: Murmur heard.     Harsh midsystolic murmur is present with a grade of 3/6 at the upper right sternal border radiating to the neck.     High-pitched blowing decrescendo early diastolic murmur is present with a grade of 2/4 at the upper right sternal border radiating to the apex.  Pulmonary:     Effort: Pulmonary effort is normal.     Breath sounds: Normal breath sounds. No wheezing or rales.  Musculoskeletal:     Right lower leg: No edema.     Left lower leg: No edema.      VISIT DIAGNOSES: No diagnosis found.     ASSESSMENT AND PLAN: .    Caroline Mcconnell is a 73 y.o. female with  hypertension, hyperlipidemia, former smoker, arthritis, referred for evaluation of aortic stenosis   Aortic stenosis/aortic regurgitation: Moderate to severe aortic stenosis and aortic regurgitation. However, patient remains completely asymptomatic. ProBNP in 02/2023 was normal. Previously, I discussed with the patient the following 2 options: Conservative approach with every 41-month clinical follow-up, and every 49-month echocardiogram, in absence of symptoms versus proceeding with left and right heart catheterization and evaluation by the structural valve team. In absence of symptoms, patient prefers a conservative approach.  Leg edema noted on exam today.  This could be due to amlodipine 10 mg daily, varicose veins, or fluid retention related to aortic stenosis/regurgitation. I do not think we will be able to reduce amlodipine dose without losing blood pressure control. Prescribed Lasix  per milligram daily for as needed use. Will check proBNP today. Recommend low-salt diet, leg elevation at night. Check  echocardiogram in 08/2023, unless proBNP is elevated.  In that case, we will check in sooner.  Hypertension: Fairly well-controlled on amlodipine 10 mg daily.   Mixed hyperlipidemia: Chol 192, TG 86, HDL 65, LDL 111 (08/2022). Continue Lipitor to 80 mg daily, Zetia 10 mg daily. Starting Repatha  injections today.  F/u in 3 months  Signed, Newman JINNY Lawrence, MD

## 2023-08-28 ENCOUNTER — Ambulatory Visit: Admitting: Cardiology

## 2023-09-02 ENCOUNTER — Other Ambulatory Visit: Payer: Self-pay | Admitting: Cardiology

## 2023-09-25 ENCOUNTER — Other Ambulatory Visit: Payer: Self-pay | Admitting: Cardiology

## 2023-11-20 ENCOUNTER — Emergency Department (HOSPITAL_COMMUNITY)
Admission: EM | Admit: 2023-11-20 | Discharge: 2023-11-21 | Disposition: A | Discharge status: Home / Self Care | Attending: Emergency Medicine | Admitting: Emergency Medicine

## 2023-11-20 ENCOUNTER — Encounter (HOSPITAL_COMMUNITY): Payer: Self-pay | Admitting: Emergency Medicine

## 2023-11-20 ENCOUNTER — Other Ambulatory Visit: Payer: Self-pay

## 2023-11-20 DIAGNOSIS — R197 Diarrhea, unspecified: Secondary | ICD-10-CM | POA: Diagnosis not present

## 2023-11-20 DIAGNOSIS — I10 Essential (primary) hypertension: Secondary | ICD-10-CM | POA: Diagnosis not present

## 2023-11-20 DIAGNOSIS — Z79899 Other long term (current) drug therapy: Secondary | ICD-10-CM | POA: Diagnosis not present

## 2023-11-20 DIAGNOSIS — R42 Dizziness and giddiness: Secondary | ICD-10-CM | POA: Diagnosis present

## 2023-11-20 DIAGNOSIS — R112 Nausea with vomiting, unspecified: Secondary | ICD-10-CM | POA: Diagnosis not present

## 2023-11-20 DIAGNOSIS — N390 Urinary tract infection, site not specified: Secondary | ICD-10-CM | POA: Diagnosis not present

## 2023-11-20 DIAGNOSIS — R7989 Other specified abnormal findings of blood chemistry: Secondary | ICD-10-CM | POA: Diagnosis not present

## 2023-11-20 LAB — URINALYSIS, ROUTINE W REFLEX MICROSCOPIC
Bilirubin Urine: NEGATIVE
Glucose, UA: NEGATIVE mg/dL
Hgb urine dipstick: NEGATIVE
Ketones, ur: NEGATIVE mg/dL
Nitrite: NEGATIVE
Protein, ur: >=300 mg/dL — AB
Specific Gravity, Urine: 1.023 (ref 1.005–1.030)
pH: 5 (ref 5.0–8.0)

## 2023-11-20 LAB — TROPONIN T, HIGH SENSITIVITY: Troponin T High Sensitivity: 33 ng/L — ABNORMAL HIGH (ref 0–19)

## 2023-11-20 LAB — COMPREHENSIVE METABOLIC PANEL WITH GFR
ALT: 14 U/L (ref 0–44)
AST: 26 U/L (ref 15–41)
Albumin: 3.9 g/dL (ref 3.5–5.0)
Alkaline Phosphatase: 128 U/L — ABNORMAL HIGH (ref 38–126)
Anion gap: 14 (ref 5–15)
BUN: 11 mg/dL (ref 8–23)
CO2: 25 mmol/L (ref 22–32)
Calcium: 9.9 mg/dL (ref 8.9–10.3)
Chloride: 102 mmol/L (ref 98–111)
Creatinine, Ser: 0.75 mg/dL (ref 0.44–1.00)
GFR, Estimated: >60 mL/min (ref >60)
Glucose, Bld: 120 mg/dL — ABNORMAL HIGH (ref 70–99)
Potassium: 3.2 mmol/L — ABNORMAL LOW (ref 3.5–5.1)
Sodium: 141 mmol/L (ref 135–145)
Total Bilirubin: 0.7 mg/dL (ref 0.0–1.2)
Total Protein: 8.3 g/dL — ABNORMAL HIGH (ref 6.5–8.1)

## 2023-11-20 LAB — CBC
HCT: 45.6 % (ref 36.0–46.0)
Hemoglobin: 14.5 g/dL (ref 12.0–15.0)
MCH: 28.8 pg (ref 26.0–34.0)
MCHC: 31.8 g/dL (ref 30.0–36.0)
MCV: 90.5 fL (ref 80.0–100.0)
Platelets: 368 K/uL (ref 150–400)
RBC: 5.04 MIL/uL (ref 3.87–5.11)
RDW: 14.7 % (ref 11.5–15.5)
WBC: 12.3 K/uL — ABNORMAL HIGH (ref 4.0–10.5)
nRBC: 0 % (ref 0.0–0.2)

## 2023-11-20 LAB — LIPASE, BLOOD: Lipase: 43 U/L (ref 11–51)

## 2023-11-20 MED ORDER — ONDANSETRON HCL 4 MG/2ML IJ SOLN
4.0000 mg | Freq: Once | INTRAMUSCULAR | Status: AC
  Administered 2023-11-20: 4 mg via INTRAVENOUS
  Filled 2023-11-20: qty 2

## 2023-11-20 MED ORDER — SODIUM CHLORIDE 0.9 % IV BOLUS
500.0000 mL | Freq: Once | INTRAVENOUS | Status: AC
Start: 2023-11-20 — End: 2023-11-21
  Administered 2023-11-20: 500 mL via INTRAVENOUS

## 2023-11-20 NOTE — ED Provider Notes (Signed)
 [place] EMERGENCY DEPARTMENT AT [facility] Provider Note   CSN: [id number] Arrival date & time: 11/20/23  2046     Patient presents with: Dizziness, Nausea, Emesis, and Diarrhea   Caroline Mcconnell is a 73 y.o. female with PMHx OA, HLD, HTN, thyroid disease who presents to ED  concerned for nausea, vomiting, diarrhea which started earlier today. Patient stating that she is now getting dizzy when she experiences the episodes of nausea. Denies suspicious food intake or sick contact. Denies hx of IBS/Chrons. Denies new medications or recent ABX.   Denies fever, chest pain, dyspnea, cough, rhinorrhea, congestion, dysuria, hematuria, hematochezia, hematemesis. Denies abdominal pain. Denies weakness. Patient not currently dizzy right now, but is a little nauseated.    {Add pertinent medical, surgical, social history, OB history to HPI:32947}  Dizziness Associated symptoms: diarrhea and vomiting   Emesis Associated symptoms: diarrhea   Diarrhea Associated symptoms: vomiting        Prior to Admission medications   Medication Sig Start Date End Date Taking? Authorizing Provider  amLODipine (NORVASC) 10 MG tablet Take 10 mg by mouth daily. 09/24/19   [provider]  aspirin EC (BAYER ASPIRIN EC LOW DOSE) 81 MG tablet Take 81 mg by mouth once.    [provider]  atorvastatin  (LIPITOR) 80 MG tablet Take 1 tablet by mouth once daily 09/26/23   Patwardhan, Manish J, MD  Evolocumab  (REPATHA  SURECLICK) 140 MG/ML SOAJ Inject 140 mg into the skin every 14 (fourteen) days. 07/25/23   Patwardhan, Newman PARAS, MD  ezetimibe (ZETIA) 10 MG tablet Take 10 mg by mouth daily.    [provider]  furosemide  (LASIX ) 20 MG tablet TAKE 1 TABLET BY MOUTH AS NEEDED FOR FLUID 09/04/23   Patwardhan, Newman PARAS, MD  levothyroxine (SYNTHROID) 25 MCG tablet Take 1 tab by mouth daily for thyroid Oral Once a day for 90 days 07/01/19   [provider]  meloxicam (MOBIC) 15 MG  tablet Take 1 tablet by mouth daily as needed.    [provider]    Allergies: Penicillins and Other    Review of Systems  Gastrointestinal:  Positive for diarrhea and vomiting.  Neurological:  Positive for dizziness.    Updated Vital Signs BP (!) 166/59   Pulse 67   Temp 97.6 F (36.4 C) (Oral)   Resp 14   LMP  (LMP Unknown)   SpO2 97%   Physical Exam Vitals and nursing note reviewed.  Constitutional:      General: She is not in acute distress.    Appearance: She is not ill-appearing or toxic-appearing.  HENT:     Head: Normocephalic and atraumatic.     Mouth/Throat:     Mouth: Mucous membranes are moist.  Eyes:     General: No scleral icterus.       Right eye: No discharge.        Left eye: No discharge.     Conjunctiva/sclera: Conjunctivae normal.  Cardiovascular:     Rate and Rhythm: Normal rate and regular rhythm.     Pulses: Normal pulses.     Heart sounds: Normal heart sounds. No murmur heard. Pulmonary:     Effort: Pulmonary effort is normal. No respiratory distress.     Breath sounds: Normal breath sounds. No wheezing, rhonchi or rales.  Abdominal:     General: Abdomen is flat. Bowel sounds are normal. There is no distension.     Palpations: Abdomen is soft. There is no mass.  Tenderness: There is no abdominal tenderness.  Musculoskeletal:     Right lower leg: No edema.     Left lower leg: No edema.  Skin:    General: Skin is warm and dry.     Findings: No rash.  Neurological:     General: No focal deficit present.     Mental Status: She is alert and oriented to person, place, and time. Mental status is at baseline.     Comments: GCS 15. Speech is goal oriented. No deficits appreciated to CN III-XII; symmetric eyebrow raise, no facial drooping, tongue midline. Patient has equal grip strength bilaterally with 5/5 strength against resistance in all major muscle groups bilaterally. Sensation to light touch intact. Patient moves extremities  without ataxia. Normal finger-nose-finger. No nystagmus.    Psychiatric:        Mood and Affect: Mood normal.        Behavior: Behavior normal.     (all labs ordered are listed, but only abnormal results are displayed) Labs Reviewed  COMPREHENSIVE METABOLIC PANEL WITH GFR - Abnormal; Notable for the following components:      Result Value   Potassium 3.2 (*)    Glucose, Bld 120 (*)    Total Protein 8.3 (*)    Alkaline Phosphatase 128 (*)    All other components within normal limits  CBC - Abnormal; Notable for the following components:   WBC 12.3 (*)    All other components within normal limits  LIPASE, BLOOD  URINALYSIS, ROUTINE W REFLEX MICROSCOPIC    EKG: EKG Interpretation Date/Time:  Tuesday November 20 2023 21:02:00 EDT Ventricular Rate:  66 PR Interval:  48 QRS Duration:  96 QT Interval:  454 QTC Calculation: 476 R Axis:   84  Text Interpretation: Sinus rhythm Short PR interval Borderline right axis deviation Repol abnrm, severe global ischemia (LM/MVD) new T wave inversions Confirmed by Lenor Hollering [phone]) on 11/20/2023 9:58:23 PM  Radiology: No results found.  {Document cardiac monitor, telemetry assessment procedure when appropriate:32947} Procedures   Medications Ordered in the ED - No data to display    {Click here for ABCD2, HEART and other calculators REFRESH Note before signing:1}                              Medical Decision Making Amount and/or Complexity of Data Reviewed Labs: ordered.  Risk Prescription drug management.   ***  {Document critical care time when appropriate  Document review of labs and clinical decision tools ie CHADS2VASC2, etc  Document your independent review of radiology images and any outside records  Document your discussion with family members, caretakers and with consultants  Document social determinants of health affecting pt's care  Document your decision making why or why not admission, treatments were  needed:32947:::1}   Final diagnoses:  None    ED Discharge Orders     None

## 2023-11-20 NOTE — ED Triage Notes (Signed)
 Pt BIBA from home, c/o dizziness, NVD, started earlier today and another episode before calling ems. Denies pain. Hx of hypertension. Did not take meds today.   BP 110/68 on arrival 160/100 CBG 143

## 2023-11-20 NOTE — ED Notes (Signed)
Assisted pt to the restroom with a wheelchair.

## 2023-11-21 DIAGNOSIS — R42 Dizziness and giddiness: Secondary | ICD-10-CM | POA: Diagnosis not present

## 2023-11-21 LAB — TROPONIN T, HIGH SENSITIVITY: Troponin T High Sensitivity: 27 ng/L — ABNORMAL HIGH (ref 0–19)

## 2023-11-21 MED ORDER — POTASSIUM CHLORIDE CRYS ER 20 MEQ PO TBCR
40.0000 meq | EXTENDED_RELEASE_TABLET | Freq: Once | ORAL | Status: AC
  Administered 2023-11-21: 40 meq via ORAL
  Filled 2023-11-21: qty 2

## 2023-11-21 MED ORDER — SULFAMETHOXAZOLE-TRIMETHOPRIM 800-160 MG PO TABS
1.0000 | ORAL_TABLET | Freq: Once | ORAL | Status: AC
  Administered 2023-11-21: 1 via ORAL
  Filled 2023-11-21: qty 1

## 2023-11-21 MED ORDER — SULFAMETHOXAZOLE-TRIMETHOPRIM 800-160 MG PO TABS: 1.0000 | ORAL_TABLET | Freq: Two times a day (BID) | ORAL | 0 refills | Status: AC

## 2023-11-29 NOTE — Progress Notes (Deleted)
  Cardiology Office Note   Date:  11/29/2023  ID:  [name], DOB Jul 19, 1950, MRN [id number] PCP: Stephane Leita DEL, MD  [facility] Providers Cardiologist:  Newman JINNY Lawrence, MD   History of Present Illness Caroline Mcconnell is a 73 y.o. female with a past medical history of hypertension, hyperlipidemia, former smoker, arthritis, aortic stenosis here for follow-up appointment.  At last office visit April 2025 patient was doing well without any complaints of chest pain, shortness of breath, syncope/presyncope.  She did endorse some leg swelling.  Starting on Repatha  injections for better cholesterol control.  She had moderate to severe aortic stenosis and aortic regurgitation on last echocardiogram but remained completely asymptomatic.  proBNP was normal back in January.  Conservative approach was recommended with a 41-month clinic follow-up and every 6 months doing an echocardiogram.    ROS: pertinent ROS in HPI  Studies Reviewed     Echo 08/13/23  IMPRESSIONS     1. Left ventricular ejection fraction, by estimation, is 65 to 70%. The  left ventricle has normal function. The left ventricle has no regional  wall motion abnormalities. There is moderate concentric left ventricular  hypertrophy. Left ventricular  diastolic parameters are consistent with Grade I diastolic dysfunction  (impaired relaxation).   2. Right ventricular systolic function is normal. The right ventricular  size is normal.   3. The mitral valve is normal in structure. Mild mitral valve  regurgitation. No evidence of mitral stenosis.   4. The aortic valve is tricuspid. There is moderate calcification of the  aortic valve. There is moderate thickening of the aortic valve. Aortic  valve regurgitation is moderate to severe. Severe aortic valve stenosis.  Aortic valve area, by VTI measures  0.66 cm. Aortic valve mean gradient measures 40.0 mmHg. Aortic valve Vmax  measures 4.05 m/s.   5. The inferior  vena cava is normal in size with greater than 50%  respiratory variability, suggesting right atrial pressure of 3 mmHg.   Risk Assessment/Calculations {Does this patient have ATRIAL FIBRILLATION?[phone]} No BP recorded.  {Refresh Note OR Click here to enter BP  :1}***       Physical Exam VS:  LMP  (LMP Unknown)        Wt Readings from Last 3 Encounters:  05/14/23 205 lb 6.4 oz (93.2 kg)  05/11/23 203 lb (92.1 kg)  02/13/23 203 lb (92.1 kg)    GEN: Well nourished, well developed in no acute distress NECK: No JVD; No carotid bruits CARDIAC: ***RRR, no murmurs, rubs, gallops RESPIRATORY:  Clear to auscultation without rales, wheezing or rhonchi  ABDOMEN: Soft, non-tender, non-distended EXTREMITIES:  No edema; No deformity   ASSESSMENT AND PLAN Aortic stenosis/aortic regurgitation Lower extremity edema Hypertension Hyperlipidemia    {Are you ordering a CV Procedure (e.g. stress test, cath, DCCV, TEE, etc)?   Press F2        :[id number]}  Dispo: ***  Signed, Orren LOISE Fabry, PA-C

## 2023-12-04 ENCOUNTER — Ambulatory Visit: Admitting: Physician Assistant

## 2023-12-04 DIAGNOSIS — Z79899 Other long term (current) drug therapy: Secondary | ICD-10-CM

## 2023-12-04 DIAGNOSIS — E782 Mixed hyperlipidemia: Secondary | ICD-10-CM

## 2023-12-04 DIAGNOSIS — R600 Localized edema: Secondary | ICD-10-CM

## 2023-12-04 DIAGNOSIS — I351 Nonrheumatic aortic (valve) insufficiency: Secondary | ICD-10-CM

## 2023-12-04 DIAGNOSIS — I350 Nonrheumatic aortic (valve) stenosis: Secondary | ICD-10-CM

## 2023-12-04 DIAGNOSIS — I10 Essential (primary) hypertension: Secondary | ICD-10-CM

## 2023-12-06 ENCOUNTER — Ambulatory Visit: Admitting: Physician Assistant

## 2023-12-08 ENCOUNTER — Emergency Department (HOSPITAL_COMMUNITY)
Admission: EM | Admit: 2023-12-08 | Discharge: 2023-12-08 | Disposition: A | Discharge status: Home / Self Care | Attending: Emergency Medicine | Admitting: Emergency Medicine

## 2023-12-08 ENCOUNTER — Emergency Department (HOSPITAL_COMMUNITY)

## 2023-12-08 DIAGNOSIS — Z7982 Long term (current) use of aspirin: Secondary | ICD-10-CM | POA: Diagnosis not present

## 2023-12-08 DIAGNOSIS — R42 Dizziness and giddiness: Secondary | ICD-10-CM | POA: Diagnosis not present

## 2023-12-08 DIAGNOSIS — S42252A Displaced fracture of greater tuberosity of left humerus, initial encounter for closed fracture: Secondary | ICD-10-CM | POA: Diagnosis not present

## 2023-12-08 DIAGNOSIS — I129 Hypertensive chronic kidney disease with stage 1 through stage 4 chronic kidney disease, or unspecified chronic kidney disease: Secondary | ICD-10-CM | POA: Diagnosis not present

## 2023-12-08 DIAGNOSIS — N390 Urinary tract infection, site not specified: Secondary | ICD-10-CM | POA: Diagnosis not present

## 2023-12-08 DIAGNOSIS — N189 Chronic kidney disease, unspecified: Secondary | ICD-10-CM | POA: Insufficient documentation

## 2023-12-08 DIAGNOSIS — Z79899 Other long term (current) drug therapy: Secondary | ICD-10-CM | POA: Insufficient documentation

## 2023-12-08 DIAGNOSIS — W19XXXA Unspecified fall, initial encounter: Secondary | ICD-10-CM | POA: Diagnosis not present

## 2023-12-08 DIAGNOSIS — S6992XA Unspecified injury of left wrist, hand and finger(s), initial encounter: Secondary | ICD-10-CM | POA: Diagnosis present

## 2023-12-08 LAB — CBC WITH DIFFERENTIAL/PLATELET
Abs Immature Granulocytes: 0.03 K/uL (ref 0.00–0.07)
Basophils Absolute: 0 K/uL (ref 0.0–0.1)
Basophils Relative: 0 %
Eosinophils Absolute: 0.4 K/uL (ref 0.0–0.5)
Eosinophils Relative: 3 %
HCT: 44.8 % (ref 36.0–46.0)
Hemoglobin: 14.3 g/dL (ref 12.0–15.0)
Immature Granulocytes: 0 %
Lymphocytes Relative: 22 %
Lymphs Abs: 2.6 K/uL (ref 0.7–4.0)
MCH: 29.5 pg (ref 26.0–34.0)
MCHC: 31.9 g/dL (ref 30.0–36.0)
MCV: 92.6 fL (ref 80.0–100.0)
Monocytes Absolute: 0.8 K/uL (ref 0.1–1.0)
Monocytes Relative: 7 %
Neutro Abs: 8 K/uL — ABNORMAL HIGH (ref 1.7–7.7)
Neutrophils Relative %: 68 %
Platelets: 355 K/uL (ref 150–400)
RBC: 4.84 MIL/uL (ref 3.87–5.11)
RDW: 14.6 % (ref 11.5–15.5)
WBC: 11.8 K/uL — ABNORMAL HIGH (ref 4.0–10.5)
nRBC: 0 % (ref 0.0–0.2)

## 2023-12-08 LAB — BASIC METABOLIC PANEL WITH GFR
Anion gap: 10 (ref 5–15)
BUN: 8 mg/dL (ref 8–23)
CO2: 28 mmol/L (ref 22–32)
Calcium: 9.4 mg/dL (ref 8.9–10.3)
Chloride: 103 mmol/L (ref 98–111)
Creatinine, Ser: 0.71 mg/dL (ref 0.44–1.00)
GFR, Estimated: >60 mL/min (ref >60)
Glucose, Bld: 135 mg/dL — ABNORMAL HIGH (ref 70–99)
Potassium: 3.1 mmol/L — ABNORMAL LOW (ref 3.5–5.1)
Sodium: 141 mmol/L (ref 135–145)

## 2023-12-08 LAB — URINALYSIS, ROUTINE W REFLEX MICROSCOPIC
Bilirubin Urine: NEGATIVE
Glucose, UA: NEGATIVE mg/dL
Hgb urine dipstick: NEGATIVE
Ketones, ur: NEGATIVE mg/dL
Nitrite: NEGATIVE
Protein, ur: >=300 mg/dL — AB
Specific Gravity, Urine: 1.024 (ref 1.005–1.030)
pH: 5 (ref 5.0–8.0)

## 2023-12-08 MED ORDER — MECLIZINE HCL 25 MG PO TABS
12.5000 mg | ORAL_TABLET | Freq: Once | ORAL | Status: AC
  Administered 2023-12-08: 12.5 mg via ORAL
  Filled 2023-12-08: qty 1

## 2023-12-08 MED ORDER — MECLIZINE HCL 12.5 MG PO TABS: 12.5000 mg | ORAL_TABLET | Freq: Three times a day (TID) | ORAL | 0 refills | Status: AC | PRN

## 2023-12-08 MED ORDER — CIPROFLOXACIN HCL 500 MG PO TABS: 500.0000 mg | ORAL_TABLET | Freq: Two times a day (BID) | ORAL | 0 refills | Status: DC

## 2023-12-08 NOTE — Discharge Instructions (Signed)
 You were seen for your dizziness and shoulder pain in the emergency department.  You are likely having vertigo.  At home, please take the meclizine prescribed you.  Take the ciprofloxacin for UTI.  Use a sling for the chipped bone on your shoulder.  Check your MyChart online for the results of any tests that had not resulted by the time you left the emergency department.   Follow-up with your primary doctor in 2-3 days regarding your visit.  Follow-up with orthopedics about your shoulder injury.  Return immediately to the emergency department if you experience any of the following: Worsening dizziness, fevers, flank pain, or any other concerning symptoms.    Thank you for visiting our Emergency Department. It was a pleasure taking care of you today.

## 2023-12-08 NOTE — ED Notes (Signed)
 Patient was able to ambulate with a walker. Patient tolerated ambulation well.

## 2023-12-08 NOTE — ED Provider Notes (Signed)
 [place] EMERGENCY DEPARTMENT AT [facility] Provider Note   CSN: [id number] Arrival date & time: 12/08/23  1202     Patient presents with: Dizziness   Caroline Mcconnell is a 73 y.o. female.   73 year old female with a history of aortic stenosis, CKD, and hypertension who presents to the emergency department with dizziness.  Patient reports that last night at 9 PM she started feeling quite dizzy after eating some salted potatoes that she thinks may be related to it.  Describes it as a lightheaded sensation.  Not worsened with position or head turning.  Says that she has had this happen multiple times in her life before but is never formally been diagnosed with vertigo.  No significant headache.  No chest pain or shortness of breath.  No palpitations.  No syncope.  Has already been having some increased urinary.  She is worried about a UTI.  Also says she had a fall over a week ago and injured her left shoulder.  Denies any head strike or LOC       Prior to Admission medications   Medication Sig Start Date End Date Taking? Authorizing Provider  amLODipine (NORVASC) 10 MG tablet Take 10 mg by mouth daily. 09/24/19   [provider]  aspirin EC (BAYER ASPIRIN EC LOW DOSE) 81 MG tablet Take 81 mg by mouth once.    [provider]  atorvastatin  (LIPITOR) 80 MG tablet Take 1 tablet by mouth once daily 09/26/23   Patwardhan, Manish J, MD  Evolocumab  (REPATHA  SURECLICK) 140 MG/ML SOAJ Inject 140 mg into the skin every 14 (fourteen) days. 07/25/23   Patwardhan, Newman PARAS, MD  ezetimibe (ZETIA) 10 MG tablet Take 10 mg by mouth daily.    [provider]  furosemide  (LASIX ) 20 MG tablet TAKE 1 TABLET BY MOUTH AS NEEDED FOR FLUID 09/04/23   Patwardhan, Newman PARAS, MD  levothyroxine (SYNTHROID) 25 MCG tablet Take 1 tab by mouth daily for thyroid Oral Once a day for 90 days 07/01/19   [provider]  meloxicam (MOBIC) 15 MG tablet Take 1 tablet by mouth daily as  needed.    [provider]    Allergies: Penicillins and Other    Review of Systems  Updated Vital Signs BP 137/67   Pulse 65   Temp 98.4 F (36.9 C) (Oral)   Resp 19   Ht 5' 6 (1.676 m)   Wt 93 kg   LMP  (LMP Unknown)   SpO2 99%   BMI 33.09 kg/m   Physical Exam Vitals and nursing note reviewed.  Constitutional:      General: She is not in acute distress.    Appearance: She is well-developed.  HENT:     Head: Normocephalic and atraumatic.     Right Ear: External ear normal.     Left Ear: External ear normal.     Nose: Nose normal.  Eyes:     Extraocular Movements: Extraocular movements intact.     Conjunctiva/sclera: Conjunctivae normal.     Pupils: Pupils are equal, round, and reactive to light.  Cardiovascular:     Rate and Rhythm: Normal rate. Rhythm irregular.     Heart sounds: Murmur (3/6 right base) heard.  Pulmonary:     Effort: Pulmonary effort is normal. No respiratory distress.     Breath sounds: Normal breath sounds.  Abdominal:     Palpations: There is no mass.  Musculoskeletal:     Cervical back: Normal range of  motion and neck supple.     Right lower leg: No edema.     Left lower leg: No edema.  Skin:    General: Skin is warm and dry.  Neurological:     Mental Status: She is alert and oriented to person, place, and time. Mental status is at baseline.     Comments: MENTAL STATUS: AAOx3 CRANIAL NERVES: II: Pupils equal and reactive 5 mm BL, no RAPD, no VF deficits III, IV, VI: EOM intact, no gaze preference or deviation, no nystagmus. V: normal sensation to light touch in V1, V2, and V3 segments bilaterally VII: no facial weakness or asymmetry, no nasolabial fold flattening VIII: normal hearing to speech and finger friction IX, X: normal palatal elevation, no uvular deviation XI: 5/5 head turn and 5/5 shoulder shrug bilaterally XII: midline tongue protrusion MOTOR: 5/5 strength in R shoulder flexion, elbow flexion and extension, and  grip strength. 5/5 strength in L shoulder flexion, elbow flexion and extension, and grip strength.  5/5 strength in R hip and knee flexion, knee extension, ankle plantar and dorsiflexion. 5/5 strength in L hip and knee flexion, knee extension, ankle plantar and dorsiflexion. SENSORY: Normal sensation to light touch in all extremities COORD: Normal finger to nose and heel to shin, no tremor, no dysmetria STATION: normal stance, no truncal ataxia, patient typically walks with a walker was able to stand without assistance  Negative Dix-Hallpike bilaterally  Psychiatric:        Mood and Affect: Mood normal.     (all labs ordered are listed, but only abnormal results are displayed) Labs Reviewed  CBC WITH DIFFERENTIAL/PLATELET - Abnormal; Notable for the following components:      Result Value   WBC 11.8 (*)    Neutro Abs 8.0 (*)    All other components within normal limits  BASIC METABOLIC PANEL WITH GFR - Abnormal; Notable for the following components:   Potassium 3.1 (*)    Glucose, Bld 135 (*)    All other components within normal limits  URINALYSIS, ROUTINE W REFLEX MICROSCOPIC - Abnormal; Notable for the following components:   Color, Urine AMBER (*)    APPearance CLOUDY (*)    Protein, ur >=300 (*)    Leukocytes,Ua MODERATE (*)    Bacteria, UA RARE (*)    All other components within normal limits  URINE CULTURE    EKG: EKG Interpretation Date/Time:  Saturday December 08 2023 14:32:32 EDT Ventricular Rate:  65 PR Interval:  162 QRS Duration:  105 QT Interval:  444 QTC Calculation: 462 R Axis:   18  Text Interpretation: Sinus rhythm Abnormal R-wave progression, early transition Borderline repolarization abnormality Confirmed by Yolande Charleston [phone]) on 12/08/2023 2:38:00 PM  Radiology: DG Shoulder Left Result Date: 12/08/2023 CLINICAL DATA:  Fall with left shoulder injury. EXAM: LEFT SHOULDER - 2+ VIEW COMPARISON:  None Available. FINDINGS: Lucency extends through the  greater tuberosity of the humerus, possible nondisplaced fracture. The remaining bony structures are intact and there is no dislocation. Mild degenerative changes are present at the acromioclavicular and glenohumeral joints. The soft tissues are within normal limits. IMPRESSION: Lucency at the greater tuberosity, possible nondisplaced fracture. No dislocation. Electronically Signed   By: Leita Birmingham M.D.   On: 12/08/2023 14:22   CT Head Wo Contrast Result Date: 12/08/2023 EXAM: CT HEAD WITHOUT CONTRAST 12/08/2023 01:33:49 PM TECHNIQUE: CT of the head was performed without the administration of intravenous contrast. Automated exposure control, iterative reconstruction, and/or weight based adjustment of the  mA/kV was utilized to reduce the radiation dose to as low as reasonably achievable. COMPARISON: None available. CLINICAL HISTORY: fall, dizzy FINDINGS: BRAIN AND VENTRICLES: No acute hemorrhage. No evidence of acute infarct. No hydrocephalus. No extra-axial collection. No mass effect or midline shift. Mild periventricular white matter hypodensity, likely chronic small vessel ischemic change. Atherosclerotic calcifications within the cavernous internal carotid arteries. ORBITS: No acute abnormality. SINUSES: No acute abnormality. SOFT TISSUES AND SKULL: No acute soft tissue abnormality. No skull fracture. IMPRESSION: 1. No acute intracranial abnormality 2. Atherosclerotic calcifications within the cavernous internal carotid arteries. 3. Mild periventricular white matter hypodensity, likely chronic small vessel ischemic change. Electronically signed by: Franky Stanford MD 12/08/2023 01:41 PM EDT RP Workstation: [id number]     Procedures   Medications Ordered in the ED  meclizine (ANTIVERT) tablet 12.5 mg (12.5 mg Oral Given 12/08/23 1415)                                    Medical Decision Making Amount and/or Complexity of Data Reviewed Labs: ordered. Radiology: ordered.   Caroline Mcconnell is a  73 year old female with a history of aortic stenosis, CKD, and hypertension who presents to the emergency department with dizziness.   Initial Ddx:  Vertigo, BPPV, stroke, ICH, shoulder injury, UTI  MDM/Course:  Patient presents emergency department dizziness.  Says that she has had multiple bouts of this before.  Describes it as a room spinning sensation.  Has a nonfocal neuroexam including no nystagmus.  She is able to stand without any assistance.  Dix-Hallpike was negative bilaterally.  Was given meclizine and upon re-evaluation reported that her symptoms had resolved.  I suspect it is likely peripheral vertigo especially given her normal EOM, lack of dysmetria, and ability to stand without assistance.  She typically requires a walker to get around so unable to ambulate the patient without the walker but she was able to do so successfully when we were able to get a walker.  She also complaining of shoulder pain and did obtain an x-ray which shows a possible left greater tuberosity fracture.  Was given a sling and instructed to follow-up with orthopedics.  She also having some urinary symptoms and looks like she could potentially have a UTI so we will go ahead and give her some antibiotics as well.  Return precautions discussed prior to discharge.  Will have her follow-up with her primary doctor and orthopedics  This patient presents to the ED for concern of complaints listed in HPI, this involves an extensive number of treatment options, and is a complaint that carries with it a high risk of complications and morbidity. Disposition including potential need for admission considered.   Dispo: DC Home. Return precautions discussed including, but not limited to, those listed in the AVS. Allowed pt time to ask questions which were answered fully prior to dc.  Records reviewed Outpatient Clinic Notes The following labs were independently interpreted: Chemistry and show no acute abnormality I  independently reviewed the following imaging with scope of interpretation limited to determining acute life threatening conditions related to emergency care: CT Head and agree with the radiologist interpretation with the following exceptions: none I personally reviewed and interpreted cardiac monitoring: normal sinus rhythm  I personally reviewed and interpreted the pt's EKG: see above for interpretation  I have reviewed the patients home medications and made adjustments as needed Social Determinants of health:  Geriatric  Portions  of this note were generated with Scientist, clinical (histocompatibility and immunogenetics). Dictation errors may occur despite best attempts at proofreading.     Final diagnoses:  Vertigo  Lower urinary tract infectious disease  Displaced fracture of greater tuberosity of left humerus, initial encounter for closed fracture    ED Discharge Orders     None          Yolande Lamar BROCKS, MD 12/08/23 1714

## 2023-12-08 NOTE — ED Triage Notes (Signed)
 BIB EMS from home Patient c/o dizziness and wants to be checked out Evaluated on 14th for fall with left shoulder injury Wants shoulder to be evaluated  Patient reported to ED staff she has dizziness and wants to be checked for UTI

## 2023-12-10 LAB — URINE CULTURE

## 2023-12-17 NOTE — Progress Notes (Unsigned)
  Cardiology Office Note:  .   Date:  12/17/2023  ID:  Caroline Mcconnell, DOB Sep 02, 1950, MRN [id number] PCP: Stephane Leita DEL, MD  [facility] Providers Cardiologist:  Newman JINNY Lawrence, MD {  History of Present Illness: .   Caroline Mcconnell is a 73 y.o. female with history of GERD stenosis and aortic regurgitation hypertension, hyperlipidemia, former smoker, arthritis.  At last office visit April 2025 patient was doing well without any complaints of chest pain, shortness of breath, syncope/presyncope.  She did endorse some leg swelling.  Starting on Repatha  injections for better cholesterol control.  She had moderate to severe aortic stenosis and aortic regurgitation on last echocardiogram but remained completely asymptomatic.  proBNP was normal back in January.  Conservative approach was recommended with a 81-month clinic follow-up and every 6 months doing an echocardiogram.      Valvular disease 01/2023 Aortic valve regurgitation is severe. Moderate to severe aortic valve stenosis. Aortic regurgitation PHT measures 411 msec. Aortic valve area, by VTI measures  1.14 cm. Aortic valve mean gradient measures 41.6 mmHg. Aortic valve Vmax  measures 4.18 m/s.  08/2023 preserved biventricular function.  Moderate concentric LVH.  Mild MR.  Severe aortic stenosis VTI 0.66, mean gradient 40, V-max 4.  Moderate to severe AR.  PHT 346  Social history       Patient has known history of moderate to severe AR/AS.  Reportedly completely asymptomatic so preference has been for conservative approach.  Last seen 05/2023 and they had some peripheral edema but on amlodipine 10 mg but did not want to adjust given adequately controlled blood pressure.     ROS: Denies: Chest pain, shortness of breath, orthopnea, peripheral edema, palpitations, decreased exercise intolerance, fatigue, lightheadedness.   Studies Reviewed: .         Risk Assessment/Calculations:   {Does this patient have ATRIAL  FIBRILLATION?[phone]} No BP recorded.  {Refresh Note OR Click here to enter BP  :1}***       Physical Exam:   VS:  LMP  (LMP Unknown)    Wt Readings from Last 3 Encounters:  12/08/23 205 lb (93 kg)  05/14/23 205 lb 6.4 oz (93.2 kg)  05/11/23 203 lb (92.1 kg)    GEN: Well nourished, well developed in no acute distress NECK: No JVD; No carotid bruits CARDIAC: ***RRR, no murmurs, rubs, gallops RESPIRATORY:  Clear to auscultation without rales, wheezing or rhonchi  ABDOMEN: Soft, non-tender, non-distended EXTREMITIES:  No edema; No deformity   ASSESSMENT AND PLAN: .    Valvular heart disease  Progressive disease -08/2023 preserved biventricular function.  Moderate concentric LVH.  Mild MR.  Severe aortic stenosis VTI 0.66, mean gradient 40, V-max 4.  Moderate to severe AR.  PHT 346  Plan: conservative approach with every 97-month clinical follow-up, and every 40-month echocardiogram, in absence of symptoms versus proceeding with left and right heart catheterization and evaluation by the structural valve team. In absence of symptoms, patient prefers a conservative approach. Repeat echocardiogram 04/2024.  If EF drops less than 50% will have stronger indication for valve replacement even if asymptomatic. proBNP 7 months ago 268 is a good prognostic/severity data point.   Hypertension  HLD    {Are you ordering a CV Procedure (e.g. stress test, cath, DCCV, TEE, etc)?   Press F2        :[id number]}  Dispo: ***  Signed, Thom LITTIE Sluder, PA-C

## 2023-12-20 ENCOUNTER — Ambulatory Visit: Attending: Cardiology | Admitting: Cardiology

## 2023-12-20 ENCOUNTER — Encounter: Payer: Self-pay | Admitting: Cardiology

## 2023-12-20 ENCOUNTER — Other Ambulatory Visit (HOSPITAL_COMMUNITY): Payer: Self-pay

## 2023-12-20 VITALS — BP 124/60 | HR 77 | Ht 66.0 in | Wt 198.0 lb

## 2023-12-20 DIAGNOSIS — E782 Mixed hyperlipidemia: Secondary | ICD-10-CM | POA: Diagnosis not present

## 2023-12-20 DIAGNOSIS — I351 Nonrheumatic aortic (valve) insufficiency: Secondary | ICD-10-CM | POA: Diagnosis not present

## 2023-12-20 DIAGNOSIS — I10 Essential (primary) hypertension: Secondary | ICD-10-CM | POA: Diagnosis not present

## 2023-12-20 DIAGNOSIS — I350 Nonrheumatic aortic (valve) stenosis: Secondary | ICD-10-CM | POA: Diagnosis not present

## 2023-12-20 DIAGNOSIS — N390 Urinary tract infection, site not specified: Secondary | ICD-10-CM

## 2023-12-20 LAB — CBC
Hematocrit: 41.4 % (ref 34.0–46.6)
Hemoglobin: 13.7 g/dL (ref 11.1–15.9)
MCH: 30 pg (ref 26.6–33.0)
MCHC: 33.1 g/dL (ref 31.5–35.7)
MCV: 91 fL (ref 79–97)
Platelets: 464 x10E3/uL — ABNORMAL HIGH (ref 150–450)
RBC: 4.57 x10E6/uL (ref 3.77–5.28)
RDW: 13.1 % (ref 11.7–15.4)
WBC: 10.3 x10E3/uL (ref 3.4–10.8)

## 2023-12-20 LAB — BASIC METABOLIC PANEL WITH GFR
BUN/Creatinine Ratio: 14 (ref 12–28)
BUN: 11 mg/dL (ref 8–27)
CO2: 24 mmol/L (ref 20–29)
Calcium: 9.7 mg/dL (ref 8.7–10.3)
Chloride: 100 mmol/L (ref 96–106)
Creatinine, Ser: 0.77 mg/dL (ref 0.57–1.00)
Glucose: 98 mg/dL (ref 70–99)
Potassium: 3.8 mmol/L (ref 3.5–5.2)
Sodium: 138 mmol/L (ref 134–144)
eGFR: 81 mL/min/1.73 (ref >59)

## 2023-12-20 NOTE — Patient Instructions (Signed)
 Medication Instructions:  Your physician recommends that you continue on your current medications as directed. Please refer to the Current Medication list given to you today.  *If you need a refill on your cardiac medications before your next appointment, please call your pharmacy*  Lab Work: Today: BMET, CBC If you have labs (blood work) drawn today and your tests are completely normal, you will receive your results only by: MyChart Message (if you have MyChart) OR A paper copy in the mail If you have any lab test that is abnormal or we need to change your treatment, we will call you to review the results.  Testing/Procedures: Echocardiogram  Your physician has requested that you have an echocardiogram. Echocardiography is a painless test that uses sound waves to create images of your heart. It provides your doctor with information about the size and shape of your heart and how well your heart's chambers and valves are working. This procedure takes approximately one hour. There are no restrictions for this procedure. Please do NOT wear cologne, perfume, aftershave, or lotions (deodorant is allowed). Please arrive 15 minutes prior to your appointment time.  Please note: We ask at that you not bring children with you during ultrasound (echo/ vascular) testing. Due to room size and safety concerns, children are not allowed in the ultrasound rooms during exams. Our front office staff cannot provide observation of children in our lobby area while testing is being conducted. An adult accompanying a patient to their appointment will only be allowed in the ultrasound room at the discretion of the ultrasound technician under special circumstances. We apologize for any inconvenience.  Follow-Up: At [facility], you and your health needs are our priority.  As part of our continuing mission to provide you with exceptional heart care, our providers are all part of one team.  This team includes your  primary Cardiologist (physician) and Advanced Practice Providers or APPs (Physician Assistants and Nurse Practitioners) who all work together to provide you with the care you need, when you need it.  Your next appointment:   3 month(s)  Provider:   Newman JINNY Lawrence, MD or Thom Sluder, PA-C   We recommend signing up for the patient portal called MyChart.  Sign up information is provided on this After Visit Summary.  MyChart is used to connect with patients for Virtual Visits (Telemedicine).  Patients are able to view lab/test results, encounter notes, upcoming appointments, etc.  Non-urgent messages can be sent to your provider as well.   To learn more about what you can do with MyChart, go to [email or web address].   Other Instructions None

## 2023-12-22 ENCOUNTER — Ambulatory Visit: Payer: Self-pay | Admitting: Cardiology

## 2023-12-22 DIAGNOSIS — I351 Nonrheumatic aortic (valve) insufficiency: Secondary | ICD-10-CM

## 2023-12-24 NOTE — Telephone Encounter (Signed)
 S/W patient and informed patient of lab results and patient voiced understanding.

## 2024-01-01 ENCOUNTER — Telehealth: Payer: Self-pay

## 2024-01-01 DIAGNOSIS — I350 Nonrheumatic aortic (valve) stenosis: Secondary | ICD-10-CM

## 2024-01-01 NOTE — Telephone Encounter (Signed)
 Spoke to patient and advised that per Dr. Elmira and Thom Sluder, PA-C a proBNP needs to be checked. Order has been placed and patient will come next week to have this done.

## 2024-01-11 ENCOUNTER — Telehealth: Payer: Self-pay | Admitting: Pharmacy Technician

## 2024-01-11 DIAGNOSIS — I350 Nonrheumatic aortic (valve) stenosis: Secondary | ICD-10-CM

## 2024-01-11 DIAGNOSIS — E782 Mixed hyperlipidemia: Secondary | ICD-10-CM

## 2024-01-11 NOTE — Telephone Encounter (Signed)
 Hi, insurance is asking for updated labs to prove positive clinical response. Last labs found are from 03/22/23-before repatha . Can she please get updated lipid panel for this prior authorization for repatha ? Thank you   Pharmacy Patient Advocate Encounter   Received notification from CoverMyMeds that prior authorization for REPATHA  is required/requested.   Insurance verification completed.   The patient is insured through [employer].   Per test claim: PA required; PA started via CoverMyMeds. KEY [id number] . Please see clinical question(s) below that I am not finding the answer to in their chart and advise.

## 2024-01-11 NOTE — Telephone Encounter (Signed)
 Patient is due to have labs completed  sometimes this week. Patient received a call from Dr Elmira nurse on [phone].requesting  patient come to have a ProBNP   Will added a lipid to ProBNP.   Patient did not answer the phone  and she does not have voicemail.

## 2024-01-11 NOTE — Telephone Encounter (Signed)
 RN spoke to patient. She states as soon as she get transportation to the lab. She will come . She states she is relying on transportation  from [place] ( it is built into insurance coverage)  she stated the last time they brought her to office but did not come to pick her up until 8 hours later ,ant the other time she had to get [place] ( which granddgtr in [place]  arranged)    She aware to come lab when she can secure a ride.

## 2024-01-17 NOTE — Telephone Encounter (Signed)
 Still waiting on labs.

## 2024-02-05 ENCOUNTER — Other Ambulatory Visit: Payer: Self-pay | Admitting: Internal Medicine

## 2024-02-05 DIAGNOSIS — Z87891 Personal history of nicotine dependence: Secondary | ICD-10-CM

## 2024-02-12 ENCOUNTER — Other Ambulatory Visit (HOSPITAL_COMMUNITY): Payer: Self-pay

## 2024-02-12 ENCOUNTER — Ambulatory Visit (HOSPITAL_COMMUNITY)
Admission: RE | Admit: 2024-02-12 | Discharge: 2024-02-12 | Disposition: A | Discharge status: Home / Self Care | Source: Ambulatory Visit | Attending: Cardiology | Admitting: Cardiology

## 2024-02-12 DIAGNOSIS — I350 Nonrheumatic aortic (valve) stenosis: Secondary | ICD-10-CM | POA: Insufficient documentation

## 2024-02-12 LAB — ECHOCARDIOGRAM COMPLETE
AR max vel: 0.91 cm2
AV Area VTI: 0.97 cm2
AV Area mean vel: 0.89 cm2
AV Mean grad: 41.5 mmHg
AV Peak grad: 75 mmHg
Ao pk vel: 4.33 m/s
Area-P 1/2: 3.71 cm2
P 1/2 time: 288 ms
S' Lateral: 3.2 cm

## 2024-02-15 NOTE — Progress Notes (Signed)
 With her age and steady increase in the gradients, it may be matter of time before she gets symptomatic. I think we should go and ahead and have her see TAVR clinic.  Thanks MJP

## 2024-02-15 NOTE — Progress Notes (Signed)
 For severe aortic stenosis.  Thanks MJP

## 2024-02-19 ENCOUNTER — Other Ambulatory Visit

## 2024-02-19 NOTE — Progress Notes (Signed)
 Unable to leave message, will try again at later time.

## 2024-02-22 NOTE — Progress Notes (Signed)
 Unable to leave message, will try again at later time.

## 2024-02-29 NOTE — Addendum Note (Signed)
 Addended by: MANDA LYLE NOVAK on: 02/29/2024 06:27 PM   Modules accepted: Orders

## 2024-03-19 ENCOUNTER — Ambulatory Visit: Admitting: Cardiology
